# Patient Record
Sex: Female | Born: 1985 | Hispanic: No | Marital: Married | Smoking: Never smoker
Health system: Southern US, Community
[De-identification: ages and names within clinical notes are randomized; demographics above are authoritative.]

---

## 2011-07-08 DIAGNOSIS — R05 Cough: Secondary | ICD-10-CM | POA: Insufficient documentation

## 2011-07-08 DIAGNOSIS — R059 Cough, unspecified: Secondary | ICD-10-CM | POA: Insufficient documentation

## 2011-07-08 DIAGNOSIS — R509 Fever, unspecified: Secondary | ICD-10-CM | POA: Insufficient documentation

## 2011-07-08 DIAGNOSIS — R51 Headache: Secondary | ICD-10-CM | POA: Insufficient documentation

## 2011-07-08 DIAGNOSIS — R5383 Other fatigue: Secondary | ICD-10-CM | POA: Insufficient documentation

## 2011-07-08 DIAGNOSIS — R5381 Other malaise: Secondary | ICD-10-CM | POA: Insufficient documentation

## 2011-07-08 DIAGNOSIS — J029 Acute pharyngitis, unspecified: Secondary | ICD-10-CM | POA: Insufficient documentation

## 2011-07-09 ENCOUNTER — Emergency Department (HOSPITAL_COMMUNITY)
Admission: EM | Admit: 2011-07-09 | Discharge: 2011-07-09 | Disposition: A | Discharge status: Home / Self Care | Payer: Managed Care, Other (non HMO) | Attending: Emergency Medicine | Admitting: Emergency Medicine

## 2011-07-09 DIAGNOSIS — J029 Acute pharyngitis, unspecified: Secondary | ICD-10-CM

## 2011-07-09 MED ORDER — HYDROCOD POLST-CHLORPHEN POLST 10-8 MG/5ML PO LQCR: 5.0000 mL | Freq: Two times a day (BID) | ORAL | Status: DC

## 2011-07-09 MED ORDER — DEXAMETHASONE SODIUM PHOSPHATE 10 MG/ML IJ SOLN
10.0000 mg | Freq: Once | INTRAMUSCULAR | Status: AC
  Administered 2011-07-09: 10 mg via INTRAMUSCULAR
  Filled 2011-07-09: qty 1

## 2011-07-09 NOTE — ED Provider Notes (Signed)
History     CSN: [id number] Arrival date & time: 07/09/2011 12:36 AM   First MD Initiated Contact with Patient 07/09/11 0128      Chief Complaint  Patient presents with  . Sore Throat    (Consider location/radiation/quality/duration/timing/severity/associated sxs/prior treatment) HPI Comments: Patient here with cough, dry and hacking that started yesterday - states now progressed with sore throat and aching, reports fever but no chills, is able to swallow secretions.  Patient is a 25 y.o. female presenting with pharyngitis. The history is provided by the patient. No language interpreter was used.  Sore Throat This is a new problem. The current episode started yesterday. The problem occurs constantly. The problem has been unchanged. Associated symptoms include arthralgias, congestion, coughing, fatigue, a fever, headaches and a sore throat. Pertinent negatives include no anorexia, chills, joint swelling, myalgias, neck pain, swollen glands or vomiting. The symptoms are aggravated by nothing. She has tried nothing for the symptoms. The treatment provided no relief.    History reviewed. No pertinent past medical history.  History reviewed. No pertinent past surgical history.  No family history on file.  History  Substance Use Topics  . Smoking status: Never Smoker   . Smokeless tobacco: Not on file  . Alcohol Use: No    OB History    Grav Para Term Preterm Abortions TAB SAB Ect Mult Living                  Review of Systems  Constitutional: Positive for fever and fatigue. Negative for chills.  HENT: Positive for congestion and sore throat. Negative for neck pain.   Respiratory: Positive for cough.   Gastrointestinal: Negative for vomiting and anorexia.  Musculoskeletal: Positive for arthralgias. Negative for myalgias and joint swelling.  Neurological: Positive for headaches.  All other systems reviewed and are negative.    Allergies  Penicillins  Home Medications    Current Outpatient Rx  Name Route Sig Dispense Refill  . DEXTROMETHORPHAN POLISTIREX ER 30 MG/5ML PO LQCR Oral Take 60 mg by mouth as needed. cough       BP 96/64  Pulse 107  Temp(Src) 100.3 F (37.9 C) (Oral)  Resp 16  Wt 93 lb 11.2 oz (42.502 kg)  SpO2 98%  LMP 07/08/2011  Physical Exam  Nursing note and vitals reviewed. Constitutional: She is oriented to person, place, and time. She appears well-developed and well-nourished.  HENT:  Head: Normocephalic and atraumatic.  Right Ear: External ear normal.  Left Ear: External ear normal.  Nose: Nose normal.  Mouth/Throat: Oropharynx is clear and moist. No oropharyngeal exudate.  Eyes: Conjunctivae are normal. Pupils are equal, round, and reactive to light.  Neck: Normal range of motion. Neck supple.  Cardiovascular: Normal rate, regular rhythm and normal heart sounds.   Pulmonary/Chest: Effort normal and breath sounds normal. No respiratory distress. She has no wheezes. She has no rales. She exhibits no tenderness.  Abdominal: Soft. Bowel sounds are normal. There is no tenderness.  Musculoskeletal: Normal range of motion.  Lymphadenopathy:    She has no cervical adenopathy.  Neurological: She is alert and oriented to person, place, and time.  Skin: Skin is warm and dry. No rash noted.  Psychiatric: She has a normal mood and affect. Her behavior is normal. Judgment and thought content normal.    ED Course  Procedures (including critical care time)   Labs Reviewed  RAPID STREP SCREEN   No results found.   Viral Pharyngitis   MDM  Strep  negative - patient with likely viral pharyngitis - given decadron here for the swelling - will continue with conservative management.        Izola Price [place], [place] 07/09/11 [phone]

## 2011-07-09 NOTE — ED Notes (Signed)
Pt reports a sore throat since Sunday.  A cough an general body aches started just prior to Sunday and persists.

## 2011-07-09 NOTE — ED Notes (Signed)
Pt reports that yesterday she had a cough, Sunday morning she awoke with sore throat and began aching all over as the day progressed.  Pt denies nausea or vomiting.  Pt reports that she used liquid medicine for sore throat without relief

## 2011-07-10 NOTE — ED Provider Notes (Signed)
Medical screening examination/treatment/procedure(s) were performed by non-physician practitioner and as supervising physician I was immediately available for consultation/collaboration.  Juliet Rude. Rubin Payor, MD 07/10/11 1946

## 2012-07-01 ENCOUNTER — Emergency Department (HOSPITAL_COMMUNITY)
Admission: EM | Admit: 2012-07-01 | Discharge: 2012-07-01 | Disposition: A | Discharge status: Home / Self Care | Payer: Managed Care, Other (non HMO) | Attending: Emergency Medicine | Admitting: Emergency Medicine

## 2012-07-01 ENCOUNTER — Encounter (HOSPITAL_COMMUNITY): Payer: Self-pay | Admitting: *Deleted

## 2012-07-01 DIAGNOSIS — E860 Dehydration: Secondary | ICD-10-CM

## 2012-07-01 DIAGNOSIS — K2970 Gastritis, unspecified, without bleeding: Secondary | ICD-10-CM | POA: Insufficient documentation

## 2012-07-01 LAB — URINALYSIS, ROUTINE W REFLEX MICROSCOPIC
Hgb urine dipstick: NEGATIVE
Nitrite: NEGATIVE
Specific Gravity, Urine: 1.014 (ref 1.005–1.030)
Urobilinogen, UA: 0.2 mg/dL (ref 0.0–1.0)
pH: 8 (ref 5.0–8.0)

## 2012-07-01 LAB — CBC WITH DIFFERENTIAL/PLATELET
Basophils Absolute: 0 10*3/uL (ref 0.0–0.1)
Eosinophils Relative: 0 % (ref 0–5)
HCT: 38.7 % (ref 36.0–46.0)
Hemoglobin: 13.4 g/dL (ref 12.0–15.0)
Lymphocytes Relative: 24 % (ref 12–46)
Lymphs Abs: 1.8 10*3/uL (ref 0.7–4.0)
MCV: 82.5 fL (ref 78.0–100.0)
Monocytes Absolute: 0.4 10*3/uL (ref 0.1–1.0)
Monocytes Relative: 5 % (ref 3–12)
Neutro Abs: 5.2 10*3/uL (ref 1.7–7.7)
RBC: 4.69 MIL/uL (ref 3.87–5.11)
RDW: 13.5 % (ref 11.5–15.5)
WBC: 7.4 10*3/uL (ref 4.0–10.5)

## 2012-07-01 LAB — COMPREHENSIVE METABOLIC PANEL
Albumin: 4.3 g/dL (ref 3.5–5.2)
BUN: 10 mg/dL (ref 6–23)
Calcium: 9.6 mg/dL (ref 8.4–10.5)
Creatinine, Ser: 0.67 mg/dL (ref 0.50–1.10)
GFR calc Af Amer: >90 mL/min (ref >90)
Glucose, Bld: 84 mg/dL (ref 70–99)
Potassium: 3.9 mEq/L (ref 3.5–5.1)
Total Protein: 7.9 g/dL (ref 6.0–8.3)

## 2012-07-01 LAB — URINE MICROSCOPIC-ADD ON

## 2012-07-01 MED ORDER — ONDANSETRON 8 MG PO TBDP
8.0000 mg | ORAL_TABLET | Freq: Once | ORAL | Status: AC
  Administered 2012-07-01: 8 mg via ORAL
  Filled 2012-07-01: qty 1

## 2012-07-01 MED ORDER — ONDANSETRON HCL 8 MG PO TABS: 8.0000 mg | ORAL_TABLET | Freq: Three times a day (TID) | ORAL | Status: DC | PRN

## 2012-07-01 MED ORDER — OMEPRAZOLE 20 MG PO CPDR: 20.0000 mg | DELAYED_RELEASE_CAPSULE | Freq: Every day | ORAL | Status: DC

## 2012-07-01 MED ORDER — PANTOPRAZOLE SODIUM 40 MG PO TBEC
40.0000 mg | DELAYED_RELEASE_TABLET | Freq: Once | ORAL | Status: AC
  Administered 2012-07-01: 40 mg via ORAL
  Filled 2012-07-01: qty 1

## 2012-07-01 NOTE — ED Provider Notes (Signed)
History     CSN: [id number]  Arrival date & time 07/01/12  7829   First MD Initiated Contact with Patient 07/01/12 2043      Chief Complaint  Patient presents with  . Dizziness    (Consider location/radiation/quality/duration/timing/severity/associated sxs/prior treatment) HPI History provided by pt.   Pt c/o 3 days of intermittent lightheadedness, generalized weakness, palpitations, described as being aware of heartbeat, chills and nausea.  Has also had intermittent, mild posterior headache.  Has not had fever, sore throat, CP/SOB, abd pain, diarrhea, GU sx or rash.  Periods have been regular.  No PMH.   History reviewed. No pertinent past medical history.  History reviewed. No pertinent past surgical history.  No family history on file.  History  Substance Use Topics  . Smoking status: Never Smoker   . Smokeless tobacco: Not on file  . Alcohol Use: No    OB History    Grav Para Term Preterm Abortions TAB SAB Ect Mult Living                  Review of Systems  All other systems reviewed and are negative.    Allergies  Penicillins  Home Medications  No current outpatient prescriptions on file.  BP 112/68  Pulse 111  Temp 98.6 F (37 C) (Oral)  Resp 18  SpO2 100%  LMP 05/31/2012  Physical Exam  Nursing note and vitals reviewed. Constitutional: She is oriented to person, place, and time. She appears well-developed and well-nourished. No distress.  HENT:  Head: Normocephalic and atraumatic.  Mouth/Throat: Oropharynx is clear and moist. No oropharyngeal exudate.  Eyes:       Normal appearance  Neck: Normal range of motion.  Cardiovascular: Normal rate, regular rhythm and intact distal pulses.   Pulmonary/Chest: Effort normal and breath sounds normal.  Abdominal: Soft. Bowel sounds are normal. She exhibits no distension and no mass. There is tenderness. There is no rebound and no guarding.       Mild tenderness down mid-line abd  Genitourinary:   No CVA ttp  Musculoskeletal: Normal range of motion.       No LE edema  Lymphadenopathy:    She has no cervical adenopathy.  Neurological: She is alert and oriented to person, place, and time. No sensory deficit. Coordination normal.       CN 3-12 intact.  No nystagmus.  5/5 and equal upper and lower extremity strength.  No past pointing.    Skin: Skin is warm and dry. No rash noted.  Psychiatric: She has a normal mood and affect. Her behavior is normal.    ED Course  Procedures (including critical care time)   Date: 07/02/2012  Rate: 101  Rhythm: sinus tachycardia  QRS Axis: normal  Intervals: normal  ST/T Wave abnormalities: normal  Conduction Disutrbances:none  Narrative Interpretation:   Old EKG Reviewed: none available   Labs Reviewed  URINALYSIS, ROUTINE W REFLEX MICROSCOPIC - Abnormal; Notable for the following:    Leukocytes, UA SMALL (*)     All other components within normal limits  COMPREHENSIVE METABOLIC PANEL  CBC WITH DIFFERENTIAL  PREGNANCY, URINE  URINE MICROSCOPIC-ADD ON   No results found.   1. Dehydration   2. Gastritis       MDM  26yo healthy F presents w/ vague complaints, including 3 days of intermittent lightheadedness, generalized weakness, nausea and palpitations.  On exam, afebrile, non-toxic appearing, moist mucous membranes,  RRR, lungs clear, mid-line abd ttp, no focal neuro deficits.  Labs unremarkable w/ exception of U/A and urine preg which are pending. EKG non-ischemic and sinus.    Labs unremarkable.  Results discussed w/ pt.  On re-examination of abdomen, tenderness isolated to epigastrium and RUQ.  Suspect gastritis w/ mild dehydration.  Pt is tolerating pos.  She has received po protonix and zofran.  Referred to [facility].  Also referred to ENT for complaint of having difficulty swallowing her secretions in her sleep x 13 years at time of discharge.  Return precautions discussed. 11:03 PM         Otilio Miu,  PA-C 07/02/12 0429  Otilio Miu, PA-C 07/02/12 [phone]

## 2012-07-01 NOTE — ED Notes (Signed)
MD at bedside. 

## 2012-07-01 NOTE — ED Notes (Signed)
Tech notified patient of need for urine sample- patient unable to void

## 2012-07-01 NOTE — ED Notes (Signed)
Pt c/o legs/hands feeling cold; dizziness; can't stand for long periods of time; feeling weak x 2 days; no pain

## 2012-07-04 NOTE — ED Provider Notes (Signed)
Medical screening examination/treatment/procedure(s) were performed by non-physician practitioner and as supervising physician I was immediately available for consultation/collaboration.  Flint Melter, MD 07/04/12 [phone]

## 2013-05-29 ENCOUNTER — Encounter (HOSPITAL_COMMUNITY): Payer: Self-pay | Admitting: Emergency Medicine

## 2013-05-29 ENCOUNTER — Emergency Department (HOSPITAL_COMMUNITY)
Admission: EM | Admit: 2013-05-29 | Discharge: 2013-05-29 | Disposition: A | Discharge status: Home / Self Care | Payer: Managed Care, Other (non HMO) | Attending: Emergency Medicine | Admitting: Emergency Medicine

## 2013-05-29 DIAGNOSIS — Z3202 Encounter for pregnancy test, result negative: Secondary | ICD-10-CM | POA: Insufficient documentation

## 2013-05-29 DIAGNOSIS — E860 Dehydration: Secondary | ICD-10-CM | POA: Insufficient documentation

## 2013-05-29 DIAGNOSIS — Z880 Allergy status to penicillin: Secondary | ICD-10-CM | POA: Insufficient documentation

## 2013-05-29 LAB — CBC WITH DIFFERENTIAL/PLATELET
Basophils Absolute: 0 10*3/uL (ref 0.0–0.1)
Basophils Relative: 1 % (ref 0–1)
Eosinophils Absolute: 0.1 10*3/uL (ref 0.0–0.7)
Eosinophils Relative: 2 % (ref 0–5)
MCH: 27.5 pg (ref 26.0–34.0)
MCV: 81.3 fL (ref 78.0–100.0)
Platelets: 182 10*3/uL (ref 150–400)
RDW: 14 % (ref 11.5–15.5)

## 2013-05-29 LAB — COMPREHENSIVE METABOLIC PANEL
ALT: 12 U/L (ref 0–35)
AST: 22 U/L (ref 0–37)
Albumin: 4.4 g/dL (ref 3.5–5.2)
Calcium: 9.8 mg/dL (ref 8.4–10.5)
GFR calc Af Amer: >90 mL/min (ref >90)
Sodium: 137 mEq/L (ref 135–145)
Total Protein: 8 g/dL (ref 6.0–8.3)

## 2013-05-29 LAB — URINALYSIS, ROUTINE W REFLEX MICROSCOPIC
Bilirubin Urine: NEGATIVE
Hgb urine dipstick: NEGATIVE
Nitrite: NEGATIVE
Protein, ur: NEGATIVE mg/dL
Urobilinogen, UA: 0.2 mg/dL (ref 0.0–1.0)

## 2013-05-29 MED ORDER — SODIUM CHLORIDE 0.9 % IV BOLUS (SEPSIS)
1000.0000 mL | Freq: Once | INTRAVENOUS | Status: AC
  Administered 2013-05-29: 1000 mL via INTRAVENOUS

## 2013-05-29 MED ORDER — SODIUM CHLORIDE 0.9 % IV SOLN
INTRAVENOUS | Status: DC
  Administered 2013-05-29: 16:00:00 via INTRAVENOUS

## 2013-05-29 NOTE — Progress Notes (Signed)
Patient confirms that she does not have a pcp.  Patient confirmed she has [employer].  EDCM provided patient with a list of pcps who accept [employer] within five miles of her zip code.  EDCM explained the importance of having a pcp.  Patient thankful for resources.  No further needs at this time.  [facility] offered patient support.

## 2013-05-29 NOTE — ED Notes (Signed)
Pt escorted to discharge window. Verbalized understanding discharge instructions. In no acute distress.   

## 2013-05-29 NOTE — ED Provider Notes (Signed)
CSN: [id number]     Arrival date & time 05/29/13  1443 History   First MD Initiated Contact with Patient 05/29/13 1502     Chief Complaint  Patient presents with  . Dizziness   (Consider location/radiation/quality/duration/timing/severity/associated sxs/prior Treatment) The history is provided by the patient.  pt here with dizziness x 5 days--sx worse with standing and described as room spinning--no headache or ear pain, no nausea or emesis--no prior h/o same, no tx used pta--denies and chest pain, dyspnea, abd pain, vaginal bleeding--took a home preg test last pm that was neg per pt--sx last for seconds to minutes and can occur even when pt is sitting--no prior h/o same, no new meds--denies fever or diarrhea  History reviewed. No pertinent past medical history. History reviewed. No pertinent past surgical history. No family history on file. History  Substance Use Topics  . Smoking status: Never Smoker   . Smokeless tobacco: Not on file  . Alcohol Use: No   OB History   Grav Para Term Preterm Abortions TAB SAB Ect Mult Living                 Review of Systems  All other systems reviewed and are negative.    Allergies  Penicillins  Home Medications  No current outpatient prescriptions on file. BP 101/63  Pulse 80  Temp(Src) 98.4 F (36.9 C) (Oral)  Resp 18  SpO2 100%  LMP 04/30/2013 Physical Exam  Nursing note and vitals reviewed. Constitutional: She is oriented to person, place, and time. She appears well-developed and well-nourished.  Non-toxic appearance. No distress.  HENT:  Head: Normocephalic and atraumatic.  Eyes: Conjunctivae, EOM and lids are normal. Pupils are equal, round, and reactive to light.  Neck: Normal range of motion. Neck supple. No tracheal deviation present. No mass present.  Cardiovascular: Normal rate, regular rhythm and normal heart sounds.  Exam reveals no gallop.   No murmur heard. Pulmonary/Chest: Effort normal and breath sounds normal.  No stridor. No respiratory distress. She has no decreased breath sounds. She has no wheezes. She has no rhonchi. She has no rales.  Abdominal: Soft. Normal appearance and bowel sounds are normal. She exhibits no distension. There is no tenderness. There is no rebound and no CVA tenderness.  Musculoskeletal: Normal range of motion. She exhibits no edema and no tenderness.  Neurological: She is alert and oriented to person, place, and time. She has normal strength. No cranial nerve deficit or sensory deficit. GCS eye subscore is 4. GCS verbal subscore is 5. GCS motor subscore is 6.  Skin: Skin is warm and dry. No abrasion and no rash noted.  Psychiatric: She has a normal mood and affect. Her speech is normal and behavior is normal.    ED Course  Procedures (including critical care time) Labs Review Labs Reviewed  URINALYSIS, ROUTINE W REFLEX MICROSCOPIC  CBC WITH DIFFERENTIAL  COMPREHENSIVE METABOLIC PANEL   Imaging Review No results found.  EKG Interpretation     Ventricular Rate:  88 PR Interval:  137 QRS Duration: 72 QT Interval:  359 QTC Calculation: 434 R Axis:   67 Text Interpretation:  Sinus rhythm Baseline wander in lead(s) V6            MDM  No diagnosis found. Patient given IV fluids here and feels better. She relates that she only eats once per day and has had increased stress caused by her studies at the [employer]. Patient is not orthostatic and is stable for discharge at  this time    Toy Baker, MD 05/29/13 [phone]

## 2013-05-29 NOTE — ED Notes (Signed)
Pt denies dizziness at moment. Assisting pt to bathroom.

## 2013-05-29 NOTE — ED Notes (Signed)
Per pt dizzy and nauseated for 5 days

## 2013-06-07 ENCOUNTER — Emergency Department (HOSPITAL_COMMUNITY)
Admission: EM | Admit: 2013-06-07 | Discharge: 2013-06-07 | Disposition: A | Discharge status: Home / Self Care | Payer: Managed Care, Other (non HMO) | Attending: Emergency Medicine | Admitting: Emergency Medicine

## 2013-06-07 ENCOUNTER — Encounter (HOSPITAL_COMMUNITY): Payer: Self-pay | Admitting: Emergency Medicine

## 2013-06-07 ENCOUNTER — Emergency Department (HOSPITAL_COMMUNITY): Payer: Managed Care, Other (non HMO)

## 2013-06-07 DIAGNOSIS — O200 Threatened abortion: Secondary | ICD-10-CM | POA: Diagnosis present

## 2013-06-07 DIAGNOSIS — R1031 Right lower quadrant pain: Secondary | ICD-10-CM | POA: Diagnosis present

## 2013-06-07 DIAGNOSIS — O209 Hemorrhage in early pregnancy, unspecified: Secondary | ICD-10-CM | POA: Insufficient documentation

## 2013-06-07 DIAGNOSIS — Z3490 Encounter for supervision of normal pregnancy, unspecified, unspecified trimester: Secondary | ICD-10-CM

## 2013-06-07 DIAGNOSIS — Z880 Allergy status to penicillin: Secondary | ICD-10-CM | POA: Insufficient documentation

## 2013-06-07 LAB — URINALYSIS, ROUTINE W REFLEX MICROSCOPIC
Bilirubin Urine: NEGATIVE
Hgb urine dipstick: NEGATIVE
Nitrite: NEGATIVE
Protein, ur: NEGATIVE mg/dL
Specific Gravity, Urine: 1.018 (ref 1.005–1.030)
Urobilinogen, UA: 0.2 mg/dL (ref 0.0–1.0)

## 2013-06-07 LAB — CBC
Hemoglobin: 11.8 g/dL — ABNORMAL LOW (ref 12.0–15.0)
MCH: 27.6 pg (ref 26.0–34.0)
MCHC: 33.7 g/dL (ref 30.0–36.0)
MCV: 82 fL (ref 78.0–100.0)
RBC: 4.27 MIL/uL (ref 3.87–5.11)
RDW: 14.3 % (ref 11.5–15.5)

## 2013-06-07 LAB — POCT PREGNANCY, URINE: Preg Test, Ur: POSITIVE — AB

## 2013-06-07 LAB — ABO/RH: ABO/RH(D): B NEG

## 2013-06-07 MED ORDER — RHO D IMMUNE GLOBULIN 1500 UNIT/2ML IJ SOLN
300.0000 ug | Freq: Once | INTRAMUSCULAR | Status: AC
  Administered 2013-06-07: 300 ug via INTRAMUSCULAR
  Filled 2013-06-07: qty 2

## 2013-06-07 NOTE — ED Notes (Signed)
Pt stated that she is continuing to "spot". Pt stated that her period was 2 days late. No increase pain reported

## 2013-06-07 NOTE — ED Notes (Signed)
Pt states that she took a pregnancy test this morning and it was positive.  When asked why she came to the ER for this, pt stated "Well, I'm spotting too".  Denies pain.

## 2013-06-07 NOTE — ED Notes (Signed)
Pt came to ER after taking a pregnancy test this morning that was positive. Pt also c/o scant amount of blood everyday when going to the bathroom. Pt c/o intermittent cramping pain 1/10.  Pt also c/o intermittent nausea. Denies dizzines, vomiting, fever. A&Ox4. NAD noted.

## 2013-06-07 NOTE — ED Provider Notes (Signed)
CSN: [id number]     Arrival date & time 06/07/13  1302 History   First MD Initiated Contact with Patient 06/07/13 1718     Chief Complaint  Patient presents with  . Vaginal Bleeding  . Possible Pregnancy   (Consider location/radiation/quality/duration/timing/severity/associated sxs/prior Treatment) Patient is a 27 y.o. female presenting with vaginal bleeding and pregnancy problem. The history is provided by the patient.  Vaginal Bleeding Quality:  Spotting Severity:  Mild Onset quality:  Sudden Duration:  3 days Timing:  Intermittent Progression:  Unchanged Chronicity:  New Possible pregnancy: yes   Context: at rest   Relieved by:  Nothing Worsened by:  Nothing tried Ineffective treatments:  None tried Associated symptoms: abdominal pain (mild occasional sharp pain in RLQ)   Associated symptoms: no back pain, no dizziness, no dysuria, no fatigue, no fever and no nausea   Possible Pregnancy  Primary symptoms include abdominal pain (mild occasional sharp pain in RLQ) and vaginal bleeding.  Associated symptoms include no dysuria, no fever, no headaches, no nausea, no shortness of breath and no vomiting.    History reviewed. No pertinent past medical history. No past surgical history on file. No family history on file. History  Substance Use Topics  . Smoking status: Never Smoker   . Smokeless tobacco: Not on file  . Alcohol Use: No   OB History   Grav Para Term Preterm Abortions TAB SAB Ect Mult Living                 Review of Systems  Constitutional: Negative for fever and fatigue.  HENT: Negative for congestion and drooling.   Eyes: Negative for pain.  Respiratory: Negative for cough and shortness of breath.   Cardiovascular: Negative for chest pain.  Gastrointestinal: Positive for abdominal pain (mild occasional sharp pain in RLQ). Negative for nausea, vomiting and diarrhea.  Genitourinary: Positive for vaginal bleeding. Negative for dysuria and hematuria.   Musculoskeletal: Negative for back pain, gait problem and neck pain.  Skin: Negative for color change.  Neurological: Negative for dizziness and headaches.  Hematological: Negative for adenopathy.  Psychiatric/Behavioral: Negative for behavioral problems.  All other systems reviewed and are negative.    Allergies  Penicillins  Home Medications  No current outpatient prescriptions on file. BP 104/71  Pulse 91  Temp(Src) 99.1 F (37.3 C) (Oral)  Resp 18  SpO2 100%  LMP 05/30/2013 Physical Exam  Nursing note and vitals reviewed. Constitutional: She is oriented to person, place, and time. She appears well-developed and well-nourished.  HENT:  Head: Normocephalic.  Mouth/Throat: Oropharynx is clear and moist. No oropharyngeal exudate.  Eyes: Conjunctivae and EOM are normal. Pupils are equal, round, and reactive to light.  Neck: Normal range of motion. Neck supple.  Cardiovascular: Normal rate, regular rhythm, normal heart sounds and intact distal pulses.  Exam reveals no gallop and no friction rub.   No murmur heard. Pulmonary/Chest: Effort normal and breath sounds normal. No respiratory distress. She has no wheezes.  Abdominal: Soft. Bowel sounds are normal. There is no tenderness. There is no rebound and no guarding.  Musculoskeletal: Normal range of motion. She exhibits no edema and no tenderness.  Neurological: She is alert and oriented to person, place, and time.  Skin: Skin is warm and dry.  Psychiatric: She has a normal mood and affect. Her behavior is normal.    ED Course  Procedures (including critical care time) Labs Review Labs Reviewed  URINALYSIS, ROUTINE W REFLEX MICROSCOPIC - Abnormal; Notable for the  following:    APPearance CLOUDY (*)    All other components within normal limits  HCG, QUANTITATIVE, PREGNANCY - Abnormal; Notable for the following:    hCG, Beta Chain, Quant, S 1405 (*)    All other components within normal limits  CBC - Abnormal; Notable  for the following:    Hemoglobin 11.8 (*)    HCT 35.0 (*)    All other components within normal limits  POCT PREGNANCY, URINE - Abnormal; Notable for the following:    Preg Test, Ur POSITIVE (*)    All other components within normal limits  ABO/RH  RH IG WORKUP (INCLUDES ABO/RH)   Imaging Review US Ob Comp Less 14 Wks  06/07/2013   CLINICAL DATA:  27 year old female with pelvic cramps. Pink discharge. Quantitative beta HCG 1,405. Estimated gestational age by LMP 4 weeks and 6 days.  EXAM: OBSTETRIC <14 WK [place] AND TRANSVAGINAL OB US  TECHNIQUE: Both transabdominal and transvaginal ultrasound examinations were performed for complete evaluation of the gestation as well as the maternal uterus, adnexal regions, and pelvic cul-de-sac. Transvaginal technique was performed to assess early pregnancy.  COMPARISON:  None.  FINDINGS: Intrauterine gestational sac: None visible.  Yolk sac:  None  Embryo:  None  Cardiac Activity: None  Maternal uterus/adnexae:  Endometrium thickened to 12 mm and appears homogeneous.  Normal right ovary measuring 2.8 x 1.3 x 3.0 cm. Several small right ovarian follicles.  Larger left ovary measuring 4.1 x 2.4 x 2.4 cm. Several small follicles. Small peripherally hypervascular hypoechoic area measuring about 12 mm (image 42) which might represent a corpus luteum.  No pelvic free fluid.  IMPRESSION: No intrauterine gestational sac identified. No suspicious adnexal mass or pelvic free fluid.  Recommend follow-up quantitative B-HCG levels and follow-up US as necessary to confirm and/or assess viability.   Electronically Signed   By: Augusto Gamble M.D.   On: 06/07/2013 20:33   US Ob Transvaginal  06/07/2013   CLINICAL DATA:  27 year old female with pelvic cramps. Pink discharge. Quantitative beta HCG 1,405. Estimated gestational age by LMP 4 weeks and 6 days.  EXAM: OBSTETRIC <14 WK [place] AND TRANSVAGINAL OB US  TECHNIQUE: Both transabdominal and transvaginal ultrasound examinations were  performed for complete evaluation of the gestation as well as the maternal uterus, adnexal regions, and pelvic cul-de-sac. Transvaginal technique was performed to assess early pregnancy.  COMPARISON:  None.  FINDINGS: Intrauterine gestational sac: None visible.  Yolk sac:  None  Embryo:  None  Cardiac Activity: None  Maternal uterus/adnexae:  Endometrium thickened to 12 mm and appears homogeneous.  Normal right ovary measuring 2.8 x 1.3 x 3.0 cm. Several small right ovarian follicles.  Larger left ovary measuring 4.1 x 2.4 x 2.4 cm. Several small follicles. Small peripherally hypervascular hypoechoic area measuring about 12 mm (image 42) which might represent a corpus luteum.  No pelvic free fluid.  IMPRESSION: No intrauterine gestational sac identified. No suspicious adnexal mass or pelvic free fluid.  Recommend follow-up quantitative B-HCG levels and follow-up US as necessary to confirm and/or assess viability.   Electronically Signed   By: Augusto Gamble M.D.   On: 06/07/2013 20:33    EKG Interpretation   None       MDM   1. Pregnancy   2. Right lower quadrant pain   3. Threatened abortion    5:59 PM 27 y.o. female G1P0 (LMP Sept 26th) who pw occasional sharp pain in right lower quadrant and vaginal spotting for the last  2-3 days. Pt found to preg on home preg test this morning. The patient denies any symptoms on exam currently and abdomen is benign here. She is afebrile and vital signs are unremarkable here. I discussed the importance of a pelvic exam in her workup including the exclusion of trauma, infection, or expelled products of conception. She understands and refuses a pelvic exam. Offered a female to do the exam, she still refuses. Will get screening labwork.  Pt agreed to do TVUS. Hcg is 1405.   Rh neg. Gave rhogam.   No IUP seen, no findings concerning for ectopic. Pt will need f/u hcg and [place]. Discussed w/ Dr. Erin Fulling (obgyn).   9:37 PM:  I have discussed the  diagnosis/risks/treatment options with the patient and believe the pt to be eligible for discharge home to follow-up with Dr. Erin Fulling, pt will be called tomorrow. We also discussed returning to the ED immediately if new or worsening sx occur. We discussed the sx which are most concerning (e.g., worsening pain, syncope, fever) that necessitate immediate return. Any new prescriptions provided to the patient are listed below.  There are no discharge medications for this patient.    Junius Argyle, MD 06/07/13 [phone]

## 2013-06-08 LAB — RH IG WORKUP (INCLUDES ABO/RH)
ABO/RH(D): B NEG
Antibody Screen: NEGATIVE
Unit division: 0

## 2013-06-10 ENCOUNTER — Encounter: Payer: Self-pay | Admitting: Obstetrics & Gynecology

## 2013-06-10 ENCOUNTER — Ambulatory Visit (INDEPENDENT_AMBULATORY_CARE_PROVIDER_SITE_OTHER): Payer: Managed Care, Other (non HMO) | Admitting: Obstetrics & Gynecology

## 2013-06-10 VITALS — BP 101/66 | HR 82 | Temp 97.7°F | Resp 16 | Ht 61.0 in | Wt 94.6 lb

## 2013-06-10 DIAGNOSIS — O209 Hemorrhage in early pregnancy, unspecified: Secondary | ICD-10-CM

## 2013-06-10 MED ORDER — PRENATAL VITAMINS 0.8 MG PO TABS: 1.0000 | ORAL_TABLET | Freq: Every day | ORAL | Status: DC

## 2013-06-10 NOTE — Progress Notes (Signed)
Pt seen @ [facility] ED for vaginal bleeding- had +UPT but negative [place].

## 2013-06-10 NOTE — Patient Instructions (Signed)
Vaginal Bleeding During Pregnancy, First Trimester A small amount of bleeding (spotting) is relatively common in early pregnancy. It usually stops on its own. There are many causes for bleeding or spotting in early pregnancy. Some bleeding may be related to the pregnancy and some may not. Cramping with the bleeding is more serious and concerning. Tell your caregiver if you have any vaginal bleeding.  CAUSES   It is normal in most cases.  The pregnancy ends (miscarriage).  The pregnancy may end (threatened miscarriage).  Infection or inflammation of the cervix.  Growths (polyps) on the cervix.  Pregnancy happens outside of the uterus and in a fallopian tube (tubal pregnancy).  Many tiny cysts in the uterus instead of pregnancy tissue (molar pregnancy). SYMPTOMS  Vaginal bleeding or spotting with or without cramps. DIAGNOSIS  To evaluate the pregnancy, your caregiver may:  Do a pelvic exam.  Take blood tests.  Do an ultrasound. It is very important to follow your caregiver's instructions.  TREATMENT   Evaluation of the pregnancy with blood tests and ultrasound.  Bed rest (getting up to use the bathroom only).  Rho-gam immunization if the mother is Rh negative and the father is Rh positive. HOME CARE INSTRUCTIONS   If your caregiver orders bed rest, you may need to make arrangements for the care of other children and for other responsibilities. However, your caregiver may allow you to continue light activity.  Keep track of the number of pads you use each day, how often you change pads and how soaked (saturated) they are. Write this down.  Do not use tampons. Do not douche.  Do not have sexual intercourse or orgasms until approved by your physician.  Save any tissue that you pass for your caregiver to see.  Take medicine for cramps only with your caregiver's permission.  Do not take aspirin because it can make you bleed. SEEK IMMEDIATE MEDICAL CARE IF:   You  experience severe cramps in your stomach, back or belly (abdomen).  You have an oral temperature above 102 F (38.9 C), not controlled by medicine.  You pass large clots or tissue.  Your bleeding increases or you become light-headed, weak or have fainting episodes.  You develop chills.  You are leaking or have a gush of fluid from your vagina.  You pass out while having a bowel movement. That may mean you have a ruptured tubal pregnancy. Document Released: 05/02/2005 Document Revised: 10/15/2011 Document Reviewed: 11/11/2008 [facility] Patient Information 2014 [place], [place]. Ectopic Pregnancy An ectopic pregnancy is when the fertilized egg attaches (implants) outside the uterus. Most ectopic pregnancies occur in the fallopian tube. Rarely do ectopic pregnancies occur on the ovary, intestine, pelvis, or cervix. An ectopic pregnancy does not have the ability to develop into a normal, healthy baby.  A ruptured ectopic pregnancy is one in which the fallopian tube gets torn or bursts and results in internal bleeding. Often there is intense abdominal pain, and sometimes, vaginal bleeding. Having an ectopic pregnancy can be a life-threatening experience. If left untreated, this dangerous condition can lead to a blood transfusion, abdominal surgery, or even death. CAUSES  Damage to the fallopian tubes is the suspected cause in most ectopic pregnancies.  RISK FACTORS Depending on your circumstances, the amount of risk of having an ectopic pregnancy will vary. There are 3 categories that may help you identify whether you are potentially at risk. High Risk  You have gone through infertility treatment.  You have had a previous ectopic pregnancy.  You have had previous tubal surgery.  You have had previous surgery to have the fallopian tubes tied (tubal ligation).  You have tubal problems or diseases.  You have been exposed to DES. DES is a medicine that was used until 1971 and had effects on  babies whose mothers took the medicine.  You become pregnant while using an intrauterine device (IUD) for birth control. Moderate Risk  You have a history of infertility.  You have a history of a sexually transmitted infection (STI).  You have a history of pelvic inflammatory disease (PID).  You have scarring from endometriosis.  You have multiple sexual partners.  You smoke. Low Risk  You have had previous pelvic surgery.  You use vaginal douching.  You became sexually active before 26 years of age. SYMPTOMS  An ectopic pregnancy should be suspected in anyone who has missed a period and has abdominal pain or bleeding.  You may experience normal pregnancy symptoms, such as:  Nausea.  Tiredness.  Breast tenderness.  Symptoms that are not normal include:  Pain with intercourse.  Irregular vaginal bleeding or spotting.  Cramping or pain on one side, or in the lower abdomen.  Fast heartbeat.  Passing out while having a bowel movement.  Symptoms of a ruptured ectopic pregnancy and internal bleeding may include:  Sudden, severe pain in the abdomen and pelvis.  Dizziness or fainting.  Pain in the shoulder area. DIAGNOSIS  Tests that may be performed include:  A pregnancy test.  An ultrasound.  Testing the specific level of pregnancy hormone in the bloodstream.  Taking a sample of uterus tissue (dilation and curettage, D&C).  Surgery to perform a visual exam of the inside of the abdomen using a lighted tube (laparoscopy). TREATMENT  An injection of methotrexate medicine may be given. This is given if:  The diagnosis is made early.  The fallopian tube has not ruptured.  You are considered to be a good candidate for the medicine. Usually, pregnancy hormone blood levels are checked after methotrexate treatment. This is to be sure the medicine is effective. It may take 4 to 6 weeks for the pregnancy to be absorbed (though most pregnancies will be  absorbed by 3 weeks). Surgical treatment may be needed. A lighted tube (laparoscope) is used to remove the tubal pregnancy. If severe internal bleeding occurs, a cut (incision) may be made in the lower abdomen (laparotomy), and the ectopic pregnancy is removed. This stops the bleeding. Part of the fallopian tube, or the whole tube, may be removed as well (salpingectomy). After surgery, pregnancy hormone tests may be done to be sure there is no pregnancy tissue left. You may receive a RhoGAM shot if you are Rh negative and the father is Rh positive, or if you do not know the Rh type of the father. This is to prevent problems with any future pregnancy. SEEK IMMEDIATE MEDICAL CARE IF:  You have any symptoms of an ectopic pregnancy. This is a medical emergency. Document Released: 08/30/2004 Document Revised: 10/15/2011 Document Reviewed: 02/19/2013 [facility] Patient Information 2014 [place], [place].

## 2013-06-10 NOTE — Progress Notes (Signed)
Subjective:     Patient ID: Brittney Knight, female   DOB: 1986/04/14, 27 y.o.   MRN: [id number]  HPI Pt was seen at [facility] ED with bleeding and pain in pregnancy.  Declined exam because it was a female provider.  sono revealed no IUP but, no adnexal masses.  She reports some discomfort but, it is not as bad as previously.  She denies further bleeding.   Review of Systems     Objective:   Physical Exam BP 101/66  Pulse 82  Temp(Src) 97.7 F (36.5 C) (Oral)  Resp 16  Ht 5\' 1"  (1.549 m)  Wt 94 lb 9.6 oz (42.91 kg)  BMI 17.88 kg/m2  LMP 06/01/2013 Pt in NAD Abd: soft, NT; ND GU: EGBUS: no lesions Vagina: no blood in vault Cervix: no lesion; no mucopurulent d/c Uterus: small, mobile slightly enlarged and nontender Adnexa: no masses; sl tender bilaterally  06/07/2013 CLINICAL DATA: 27 year old female with pelvic cramps. Pink  discharge. Quantitative beta HCG 1,405. Estimated gestational age by  LMP 4 weeks and 6 days.  EXAM:  OBSTETRIC <14 WK [place] AND TRANSVAGINAL OB US  TECHNIQUE:  Both transabdominal and transvaginal ultrasound examinations were  performed for complete evaluation of the gestation as well as the  maternal uterus, adnexal regions, and pelvic cul-de-sac.  Transvaginal technique was performed to assess early pregnancy.  COMPARISON: None.  FINDINGS:  Intrauterine gestational sac: None visible.  Yolk sac: None  Embryo: None  Cardiac Activity: None  Maternal uterus/adnexae:  Endometrium thickened to 12 mm and appears homogeneous.  Normal right ovary measuring 2.8 x 1.3 x 3.0 cm. Several small right  ovarian follicles.  Larger left ovary measuring 4.1 x 2.4 x 2.4 cm. Several small  follicles. Small peripherally hypervascular hypoechoic area  measuring about 12 mm (image 42) which might represent a corpus  luteum.  No pelvic free fluid.  IMPRESSION:  No intrauterine gestational sac identified. No suspicious adnexal  mass or pelvic free fluid.  Recommend follow-up  quantitative B-HCG levels and follow-up US as  necessary to confirm and/or assess viability.   06/07/2013 Quant hcg 1405        Assessment:     Pain and bleeding in early pregnancy - discussed with pt the possibility of ectopic pregnancy with its risks Rh- given Rhogam in the  ED     Plan:     Quant HCG today rec repeat sono when HCG >2500 to check for IUP F/u for new OB if IUP noted

## 2013-06-10 NOTE — Addendum Note (Signed)
Addended by: Candelaria Stagers E on: 06/10/2013 04:06 PM   Modules accepted: Orders

## 2013-06-15 ENCOUNTER — Ambulatory Visit (HOSPITAL_COMMUNITY)
Admission: RE | Admit: 2013-06-15 | Discharge: 2013-06-15 | Disposition: A | Discharge status: Home / Self Care | Payer: Managed Care, Other (non HMO) | Source: Ambulatory Visit | Attending: Obstetrics & Gynecology | Admitting: Obstetrics & Gynecology

## 2013-06-15 ENCOUNTER — Encounter: Payer: Self-pay | Admitting: Obstetrics & Gynecology

## 2013-06-15 DIAGNOSIS — O209 Hemorrhage in early pregnancy, unspecified: Secondary | ICD-10-CM | POA: Insufficient documentation

## 2013-06-15 DIAGNOSIS — O3680X0 Pregnancy with inconclusive fetal viability, not applicable or unspecified: Secondary | ICD-10-CM | POA: Insufficient documentation

## 2013-06-25 LAB — OB RESULTS CONSOLE RUBELLA ANTIBODY, IGM: Rubella: IMMUNE

## 2013-06-25 LAB — OB RESULTS CONSOLE HIV ANTIBODY (ROUTINE TESTING): HIV: NONREACTIVE

## 2013-06-25 LAB — OB RESULTS CONSOLE RPR: RPR: NONREACTIVE

## 2013-06-25 LAB — OB RESULTS CONSOLE ABO/RH: RH Type: NEGATIVE

## 2013-06-25 LAB — OB RESULTS CONSOLE HEPATITIS B SURFACE ANTIGEN: HEP B S AG: NEGATIVE

## 2013-06-25 LAB — OB RESULTS CONSOLE ANTIBODY SCREEN: ANTIBODY SCREEN: NEGATIVE

## 2013-07-08 LAB — OB RESULTS CONSOLE GC/CHLAMYDIA
Chlamydia: NEGATIVE
Gonorrhea: NEGATIVE

## 2013-08-06 NOTE — L&D Delivery Note (Signed)
Operative Delivery Note  FHT down to 60s and recovered to 90-100s, but stayed there about 10 min despite lateral position change, scalp stimulation, so decision was made for vacuum extraction. 2 different vacuums needed to be tried due to very narrow maternal introitus, narrow pubic arch and station at +1. Verbal consent: obtained from patient.  Risks and benefits discussed including but are not limited to the risks of anesthesia, bleeding, infection, damage to maternal tissues, fetal cephalhematoma.  There is also the risk of inability to effect vaginal delivery of the head, or shoulder dystocia that cannot be resolved by established maneuvers, leading to the need for emergency cesarean section. Patient agreed. Epidural was working very well.   At 9:22 AM a viable and healthy female was delivered via Vaginal, Vacuum (Extractor-Mashroom).  Presentation: vertex; Position: Right,, Occiput,, Anterior; Station: +1 +2. First 2 consecutive contractions and traction brought the baby down to +2. Perineum was tight, lidocaine infiltrated and left mediolateral episiotomy given. Mother continued to push with 3rd contraction without vacuum. Vacuum reapplied with 4th contraction with excellent maternal effort head delivery was achieved. All vacuum pulls were very well controlled, no pop-offs, bringing down pressure in between contractions, each application with 3 pushes with excellent materal effort, infant was delivered, was floppy and ashen, handed to NICU after cord clamped and cut.   APGAR: 4, 8; weight -pending NICU team received the infant. .   Placenta status: Intact, Spontaneous, sent to Pathology.    Cord: 3 vessels with the following complications: Marginal cord and Velamentous vessels.  Cord pH: 7.06  Anesthesia: Epidural Local: Lidocaine Instruments: Mashroom vacuum extractor Episiotomy: Left Mediolateral Lacerations: none Suture Repair: 3.0 vicryl rapide Est. Blood Loss (mL): 250 cc  Mom to  postpartum.  Baby to Couplet care / Skin to Skin.  Tradarius Reinwald R 02/05/2014, 10:39 AM

## 2013-12-09 ENCOUNTER — Other Ambulatory Visit: Payer: Self-pay

## 2013-12-14 ENCOUNTER — Other Ambulatory Visit (HOSPITAL_COMMUNITY): Payer: Self-pay | Admitting: Obstetrics & Gynecology

## 2013-12-14 DIAGNOSIS — IMO0002 Reserved for concepts with insufficient information to code with codable children: Secondary | ICD-10-CM

## 2013-12-14 DIAGNOSIS — Z3689 Encounter for other specified antenatal screening: Secondary | ICD-10-CM

## 2013-12-15 ENCOUNTER — Ambulatory Visit (HOSPITAL_COMMUNITY): Payer: Managed Care, Other (non HMO) | Attending: Obstetrics & Gynecology

## 2013-12-18 ENCOUNTER — Other Ambulatory Visit (HOSPITAL_COMMUNITY): Payer: Self-pay | Admitting: Obstetrics & Gynecology

## 2013-12-18 DIAGNOSIS — Z3689 Encounter for other specified antenatal screening: Secondary | ICD-10-CM

## 2013-12-18 DIAGNOSIS — O365990 Maternal care for other known or suspected poor fetal growth, unspecified trimester, not applicable or unspecified: Secondary | ICD-10-CM

## 2013-12-22 ENCOUNTER — Encounter (HOSPITAL_COMMUNITY): Payer: Self-pay

## 2013-12-22 ENCOUNTER — Other Ambulatory Visit (HOSPITAL_COMMUNITY): Payer: Self-pay | Admitting: Obstetrics & Gynecology

## 2013-12-22 ENCOUNTER — Ambulatory Visit (HOSPITAL_COMMUNITY)
Admission: RE | Admit: 2013-12-22 | Discharge: 2013-12-22 | Disposition: A | Discharge status: Home / Self Care | Payer: Managed Care, Other (non HMO) | Source: Ambulatory Visit | Attending: Obstetrics & Gynecology | Admitting: Obstetrics & Gynecology

## 2013-12-22 DIAGNOSIS — Z3689 Encounter for other specified antenatal screening: Secondary | ICD-10-CM

## 2013-12-22 DIAGNOSIS — O365990 Maternal care for other known or suspected poor fetal growth, unspecified trimester, not applicable or unspecified: Secondary | ICD-10-CM

## 2014-01-06 LAB — OB RESULTS CONSOLE GBS: GBS: NEGATIVE

## 2014-01-22 ENCOUNTER — Telehealth (HOSPITAL_COMMUNITY): Payer: Self-pay | Admitting: *Deleted

## 2014-01-22 ENCOUNTER — Encounter (HOSPITAL_COMMUNITY): Payer: Self-pay | Admitting: *Deleted

## 2014-01-22 NOTE — Telephone Encounter (Signed)
Preadmission screen Interpreter number [phone]

## 2014-01-24 ENCOUNTER — Inpatient Hospital Stay (HOSPITAL_COMMUNITY)
Admission: RE | Admit: 2014-01-24 | Payer: Managed Care, Other (non HMO) | Source: Ambulatory Visit | Admitting: Obstetrics & Gynecology

## 2014-02-04 ENCOUNTER — Inpatient Hospital Stay (HOSPITAL_COMMUNITY)
Admission: AD | Admit: 2014-02-04 | Discharge: 2014-02-07 | DRG: 775 | Disposition: A | Discharge status: Home / Self Care | Payer: Managed Care, Other (non HMO) | Source: Ambulatory Visit | Attending: Obstetrics & Gynecology | Admitting: Obstetrics & Gynecology

## 2014-02-04 DIAGNOSIS — Z833 Family history of diabetes mellitus: Secondary | ICD-10-CM

## 2014-02-04 DIAGNOSIS — O365990 Maternal care for other known or suspected poor fetal growth, unspecified trimester, not applicable or unspecified: Principal | ICD-10-CM | POA: Diagnosis present

## 2014-02-04 DIAGNOSIS — IMO0002 Reserved for concepts with insufficient information to code with codable children: Secondary | ICD-10-CM | POA: Diagnosis present

## 2014-02-04 DIAGNOSIS — O360990 Maternal care for other rhesus isoimmunization, unspecified trimester, not applicable or unspecified: Secondary | ICD-10-CM | POA: Diagnosis present

## 2014-02-04 MED ORDER — LACTATED RINGERS IV SOLN
INTRAVENOUS | Status: DC
Start: 2014-02-04 — End: 2014-02-05

## 2014-02-04 MED ORDER — CITRIC ACID-SODIUM CITRATE 334-500 MG/5ML PO SOLN
30.0000 mL | ORAL | Status: DC | PRN
  Filled 2014-02-04: qty 15

## 2014-02-04 MED ORDER — LACTATED RINGERS IV SOLN: 500.0000 mL | INTRAVENOUS | Status: DC | PRN

## 2014-02-04 MED ORDER — SODIUM CHLORIDE 0.9 % IJ SOLN: 3.0000 mL | INTRAMUSCULAR | Status: DC | PRN

## 2014-02-04 MED ORDER — IBUPROFEN 600 MG PO TABS
600.0000 mg | ORAL_TABLET | Freq: Four times a day (QID) | ORAL | Status: DC | PRN
  Administered 2014-02-05: 600 mg via ORAL
  Filled 2014-02-04: qty 1

## 2014-02-04 MED ORDER — MISOPROSTOL 25 MCG QUARTER TABLET
25.0000 ug | ORAL_TABLET | ORAL | Status: DC | PRN
  Administered 2014-02-04: 25 ug via VAGINAL
  Filled 2014-02-04: qty 0.25
  Filled 2014-02-04: qty 1
  Filled 2014-02-04: qty 0.25

## 2014-02-04 MED ORDER — ACETAMINOPHEN 325 MG PO TABS: 650.0000 mg | ORAL_TABLET | ORAL | Status: DC | PRN

## 2014-02-04 MED ORDER — LIDOCAINE HCL (PF) 1 % IJ SOLN
30.0000 mL | INTRAMUSCULAR | Status: AC | PRN
  Administered 2014-02-05: 30 mL via SUBCUTANEOUS
  Filled 2014-02-04: qty 30

## 2014-02-04 MED ORDER — TERBUTALINE SULFATE 1 MG/ML IJ SOLN: 0.2500 mg | Freq: Once | INTRAMUSCULAR | Status: AC | PRN

## 2014-02-04 MED ORDER — BUTORPHANOL TARTRATE 1 MG/ML IJ SOLN: 1.0000 mg | INTRAMUSCULAR | Status: DC | PRN

## 2014-02-04 MED ORDER — SODIUM CHLORIDE 0.9 % IV SOLN: 250.0000 mL | INTRAVENOUS | Status: DC | PRN

## 2014-02-04 MED ORDER — OXYTOCIN 40 UNITS IN LACTATED RINGERS INFUSION - SIMPLE MED
62.5000 mL/h | INTRAVENOUS | Status: DC
  Filled 2014-02-04: qty 1000

## 2014-02-04 MED ORDER — ONDANSETRON HCL 4 MG/2ML IJ SOLN: 4.0000 mg | Freq: Four times a day (QID) | INTRAMUSCULAR | Status: DC | PRN

## 2014-02-04 MED ORDER — SODIUM CHLORIDE 0.9 % IJ SOLN: 3.0000 mL | Freq: Two times a day (BID) | INTRAMUSCULAR | Status: DC

## 2014-02-04 MED ORDER — OXYCODONE-ACETAMINOPHEN 5-325 MG PO TABS: 1.0000 | ORAL_TABLET | ORAL | Status: DC | PRN

## 2014-02-04 MED ORDER — ZOLPIDEM TARTRATE 5 MG PO TABS: 5.0000 mg | ORAL_TABLET | Freq: Every evening | ORAL | Status: DC | PRN

## 2014-02-04 MED ORDER — OXYTOCIN BOLUS FROM INFUSION
500.0000 mL | INTRAVENOUS | Status: DC
  Administered 2014-02-05: 500 mL via INTRAVENOUS

## 2014-02-05 ENCOUNTER — Encounter (HOSPITAL_COMMUNITY): Payer: Managed Care, Other (non HMO) | Admitting: Anesthesiology

## 2014-02-05 ENCOUNTER — Inpatient Hospital Stay (HOSPITAL_COMMUNITY): Payer: Managed Care, Other (non HMO) | Admitting: Anesthesiology

## 2014-02-05 ENCOUNTER — Encounter (HOSPITAL_COMMUNITY): Payer: Self-pay | Admitting: Obstetrics and Gynecology

## 2014-02-05 LAB — RPR

## 2014-02-05 LAB — CBC
HCT: 36.7 % (ref 36.0–46.0)
Hemoglobin: 13 g/dL (ref 12.0–15.0)
MCH: 31.2 pg (ref 26.0–34.0)
MCHC: 35.4 g/dL (ref 30.0–36.0)
MCV: 88 fL (ref 78.0–100.0)
Platelets: 188 10*3/uL (ref 150–400)
RBC: 4.17 MIL/uL (ref 3.87–5.11)
RDW: 13.1 % (ref 11.5–15.5)
WBC: 9 10*3/uL (ref 4.0–10.5)

## 2014-02-05 MED ORDER — LACTATED RINGERS IV SOLN: 500.0000 mL | Freq: Once | INTRAVENOUS | Status: DC

## 2014-02-05 MED ORDER — DIPHENHYDRAMINE HCL 50 MG/ML IJ SOLN: 12.5000 mg | INTRAMUSCULAR | Status: DC | PRN

## 2014-02-05 MED ORDER — TETANUS-DIPHTH-ACELL PERTUSSIS 5-2.5-18.5 LF-MCG/0.5 IM SUSP: 0.5000 mL | Freq: Once | INTRAMUSCULAR | Status: DC

## 2014-02-05 MED ORDER — TERBUTALINE SULFATE 1 MG/ML IJ SOLN
INTRAMUSCULAR | Status: AC
  Administered 2014-02-05: 0.25 mg
  Filled 2014-02-05: qty 1

## 2014-02-05 MED ORDER — SENNOSIDES-DOCUSATE SODIUM 8.6-50 MG PO TABS
2.0000 | ORAL_TABLET | ORAL | Status: DC
  Administered 2014-02-06: 2 via ORAL
  Filled 2014-02-05: qty 2

## 2014-02-05 MED ORDER — OXYCODONE-ACETAMINOPHEN 5-325 MG PO TABS: 1.0000 | ORAL_TABLET | ORAL | Status: DC | PRN

## 2014-02-05 MED ORDER — PHENYLEPHRINE 40 MCG/ML (10ML) SYRINGE FOR IV PUSH (FOR BLOOD PRESSURE SUPPORT)
80.0000 ug | PREFILLED_SYRINGE | INTRAVENOUS | Status: DC | PRN
  Filled 2014-02-05: qty 2
  Filled 2014-02-05: qty 10

## 2014-02-05 MED ORDER — FENTANYL 2.5 MCG/ML BUPIVACAINE 1/10 % EPIDURAL INFUSION (WH - ANES)
14.0000 mL/h | INTRAMUSCULAR | Status: DC | PRN
  Administered 2014-02-05: 14 mL/h via EPIDURAL
  Filled 2014-02-05: qty 125

## 2014-02-05 MED ORDER — EPHEDRINE 5 MG/ML INJ
10.0000 mg | INTRAVENOUS | Status: DC | PRN
  Filled 2014-02-05: qty 2

## 2014-02-05 MED ORDER — IBUPROFEN 600 MG PO TABS
600.0000 mg | ORAL_TABLET | Freq: Four times a day (QID) | ORAL | Status: DC
  Administered 2014-02-05 – 2014-02-07 (×8): 600 mg via ORAL
  Filled 2014-02-05 (×8): qty 1

## 2014-02-05 MED ORDER — ONDANSETRON HCL 4 MG/2ML IJ SOLN: 4.0000 mg | INTRAMUSCULAR | Status: DC | PRN

## 2014-02-05 MED ORDER — ONDANSETRON HCL 4 MG PO TABS: 4.0000 mg | ORAL_TABLET | ORAL | Status: DC | PRN

## 2014-02-05 MED ORDER — BENZOCAINE-MENTHOL 20-0.5 % EX AERO
1.0000 "application " | INHALATION_SPRAY | CUTANEOUS | Status: DC | PRN
  Administered 2014-02-05: 1 via TOPICAL
  Filled 2014-02-05: qty 56

## 2014-02-05 MED ORDER — ZOLPIDEM TARTRATE 5 MG PO TABS: 5.0000 mg | ORAL_TABLET | Freq: Every evening | ORAL | Status: DC | PRN

## 2014-02-05 MED ORDER — DIBUCAINE 1 % RE OINT: 1.0000 "application " | TOPICAL_OINTMENT | RECTAL | Status: DC | PRN

## 2014-02-05 MED ORDER — PHENYLEPHRINE 40 MCG/ML (10ML) SYRINGE FOR IV PUSH (FOR BLOOD PRESSURE SUPPORT)
80.0000 ug | PREFILLED_SYRINGE | INTRAVENOUS | Status: DC | PRN
  Filled 2014-02-05: qty 2

## 2014-02-05 MED ORDER — WITCH HAZEL-GLYCERIN EX PADS: 1.0000 "application " | MEDICATED_PAD | CUTANEOUS | Status: DC | PRN

## 2014-02-05 MED ORDER — LIDOCAINE HCL (PF) 1 % IJ SOLN
INTRAMUSCULAR | Status: DC | PRN
  Administered 2014-02-05 (×2): 5 mL

## 2014-02-05 MED ORDER — DIPHENHYDRAMINE HCL 25 MG PO CAPS: 25.0000 mg | ORAL_CAPSULE | Freq: Four times a day (QID) | ORAL | Status: DC | PRN

## 2014-02-05 MED ORDER — SIMETHICONE 80 MG PO CHEW: 80.0000 mg | CHEWABLE_TABLET | ORAL | Status: DC | PRN

## 2014-02-05 MED ORDER — PRENATAL MULTIVITAMIN CH
1.0000 | ORAL_TABLET | Freq: Every day | ORAL | Status: DC
  Administered 2014-02-06 – 2014-02-07 (×2): 1 via ORAL
  Filled 2014-02-05 (×2): qty 1

## 2014-02-05 MED ORDER — LANOLIN HYDROUS EX OINT: TOPICAL_OINTMENT | CUTANEOUS | Status: DC | PRN

## 2014-02-05 NOTE — Progress Notes (Signed)
Patient ID: Brittney Knight, female   DOB: 25-Nov-1985, 28 y.o.   MRN: [id number] FHT reported. S/p one cytotec last night, FHT 140s/ moderate variability/ recurrent variable decels/ overall Category II. Rx Terbutaline x 1 dose. Reassess. No further cytotec.

## 2014-02-05 NOTE — Progress Notes (Signed)
Brittney Knight is a 28 y.o. G1P0 at 5163w6d, Symmetric IUGR v/s constitutionally small. Here for IOL. S/p 1 Cytotec at admission. Contractions overnight with fetal intolerance, repetitive variable decels with moderate variability and they resolved with one Terbutaline injection. Variable decels returned with return of contractions. AROM done with clear minimal fluid. Head at -2 feels unflexed/brow. Turn in left lateral and assess progress and FHT.  No narcotics. No epidural.   BP 88/44  Pulse 84  Temp(Src) 98 F (36.7 C) (Oral)  Resp 16  Ht 5\' 1"  (1.549 m)  Wt 111 lb 9.6 oz (50.621 kg)  BMI 21.10 kg/m2  LMP 05/02/2013   FHT: baseline change from 130s to 150s/ minimal variability and variable decels with some late component. Category II. UC:   irregular, every 3-5 minutes  SVE:   Dilation: 2.5 Effacement (%): 80 Station: -2 Exam by:: Dr.Denzel Etienne AROM clear.   Assessment / Plan: Fetal intolerance to labor. IUGR. Velamentour marginal cord, remote from delivery. Left lat, fluids, O2 mask and reassess. Likely C/s if not resolved since remote from delivery.    Billal Rollo R 02/05/2014, 7:16 AM

## 2014-02-05 NOTE — Anesthesia Procedure Notes (Signed)
Epidural Patient location during procedure: OB Start time: 02/05/2014 7:54 AM  Staffing Anesthesiologist: Brayton CavesJACKSON, Sai Moura Performed by: anesthesiologist   Preanesthetic Checklist Completed: patient identified, site marked, surgical consent, pre-op evaluation, timeout performed, IV checked, risks and benefits discussed and monitors and equipment checked  Epidural Patient position: sitting Prep: site prepped and draped and DuraPrep Patient monitoring: continuous pulse ox and blood pressure Approach: midline Location: L3-L4 Injection technique: LOR air  Needle:  Needle type: Tuohy  Needle gauge: 17 G Needle length: 9 cm and 9 Needle insertion depth: 5 cm cm Catheter type: closed end flexible Catheter size: 19 Gauge Catheter at skin depth: 10 cm Test dose: negative  Assessment Events: blood not aspirated, injection not painful, no injection resistance, negative IV test and no paresthesia  Additional Notes Patient identified.  Risk benefits discussed including failed block, incomplete pain control, headache, nerve damage, paralysis, blood pressure changes, nausea, vomiting, reactions to medication both toxic or allergic, and postpartum back pain.  Patient expressed understanding and wished to proceed.  All questions were answered.  Sterile technique used throughout procedure and epidural site dressed with sterile barrier dressing. No paresthesia or other complications noted.The patient did not experience any signs of intravascular injection such as tinnitus or metallic taste in mouth nor signs of intrathecal spread such as rapid motor block. Please see nursing notes for vital signs.

## 2014-02-05 NOTE — Progress Notes (Signed)
Brittney Knight is a 28 y.o. IOL for symmetric IUGR. Progressing well. FHT category II and now category I. S/p Epidural. Progressed rapidly to almost complete per RN. Head not flexed, needs exaggerated Sims, peanut ball option. Assess descent. Epidural taking time to work but pain much improved, allows pelvic exams and foley without difficulty.  BP 122/81  Pulse 72  Temp(Src) 98 F (36.7 C) (Oral)  Resp 20  Ht 5\' 1"  (1.549 m)  Wt 111 lb 9.6 oz (50.621 kg)  BMI 21.10 kg/m2  SpO2 89%  LMP 05/02/2013   FHT:  FHR: 145 bpm, variability: moderate,  accelerations:  Abscent,  decelerations:  Present variable decels with late component but resolved UC:   regular, every 3-4 minutes SVE:   Dilation: Lip/rim Effacement (%): 80 Station: 0 Exam by:: S Nix RN Continue to monitor for rotation/ flexion and descent. NICU for delivery.   Otillia Cordone R 02/05/2014, 8:40 AM

## 2014-02-05 NOTE — Anesthesia Preprocedure Evaluation (Signed)

## 2014-02-05 NOTE — H&P (Signed)
Brittney Knight is a 28 y.o. female presenting at 39.5 wks for IOL forIUGR vs constitutionally SGA infant with normal UA S/D ratio. EFW 4'12" on 6/30  PNCare from 1st trimester. Rh negative, 1st trim bleeding and Rhogam. Marginal cord insertion possible velamentous. Growth sono at 28 wks noted EFW at 18% and <10% from 32 wks. S/p MFM consult at that point and was recommended ANtesting, Dopplers and 38 wk delivery. Patient declined and continued to have wkly BPP and Dopplers that were normal. Last growth 4'12" at 0 interval growth  History OB History   Grav Para Term Preterm Abortions TAB SAB Ect Mult Living   1              No past medical history on file. No past surgical history on file. Family History: family history includes Diabetes in her maternal grandfather, maternal grandmother, paternal grandfather, and paternal grandmother; Hypertension in her maternal grandfather, maternal grandmother, and paternal grandfather. Social History:  reports that she has never smoked. She does not have any smokeless tobacco history on file. She reports that she does not drink alcohol or use illicit drugs.   Prenatal Transfer Tool  Maternal Diabetes: No Genetic Screening: Declined but AFP1 normal  Maternal Ultrasounds/Referrals: Abnormal:  Findings:   IUGR, symmetric. Velamentous marginal cord insertion Fetal Ultrasounds or other Referrals:  Referred to Materal Fetal Medicine  Maternal Substance Abuse:  No Significant Maternal Medications:  None Significant Maternal Lab Results:  Lab values include: Group B Strep negative. Rh negative, s/p Rhogam in 1st trim and at 28 wks.  Other Comments:  None  ROS  neg  Dilation: Closed Effacement (%): Thick Station: -3 Exam by:: LCarpenter,RN Blood pressure 111/73, pulse 76, temperature 98 F (36.7 C), temperature source Oral, resp. rate 16, height 5\' 1"  (1.549 m), weight 111 lb 9.6 oz (50.621 kg), last menstrual period 05/02/2013. Exam Physical Exam  A&O x  3, no acute distress. Pleasant HEENT neg, no thyromegaly Lungs CTA bilat CV RRR, S1S2 normal Abdo soft, non tender, non acute Extr no edema/ tenderness Pelvic above FHT  140s/ + accels/ no decels/ moderate variability- category I Toco rate  Prenatal labs: ABO, Rh: B/Negative/-- (11/20 0000) S/p Rhogam Antibody: Negative (11/20 0000) Rubella: Immune (11/20 0000) RPR: Nonreactive (11/20 0000)  HBsAg: Negative (11/20 0000)  HIV: Non-reactive (11/20 0000)  GBS: Negative (06/03 0000)  Normal Glucola AFP1 normal   Assessment/Plan: IOL wit Cytotec. GBS neg. EFW 4'12" VTX. Peds at birth. Narrow/flat subpubic arch.    Brittney Knight R

## 2014-02-05 NOTE — Consult Note (Signed)
Neonatology Note:   Attendance at Delivery:    I was asked by Dr. Juliene PinaMody to attend this vacuum-assisted vaginal delivery at term due to prolonged FHR deceleration (70-90 for about 12 minutes) and known IUGR infant (felt to be constitutional). The mother is a G1P0 B neg, GBS neg with velamentous cord insertion. ROM 3 hours prior to delivery, fluid clear. Infant floppy, blue, and apneic, but with HR > 100 and some reflex with suctioning at birth. We bulb suctioned for moderate, clear oral and nasal secretions and gave stimulation. The baby began to breathe but not cry out loud. We continued stimulation and gave some BBO2. A pulse oximeter was placed that showed the O2 saturation was 95% with BBO2 at 3 minutes. The baby gradually regained tone and respirations were regular by 2 minutes of life. Equal breath sounds, good air exchange, and clear breath sounds were noted. We withdrew the BBO2 and he continued to saturate at 100% in room air, so we stopped the pulse oximetry. Tone normal at 7 minutes of life. Ap 4/8/9. I spoke with the parents and let them know that the baby would need blood glucose monitoring due to SGA status. To CN to care of Pediatrician.   Doretha Souhristie C. Jadore Mcguffin, MD

## 2014-02-05 NOTE — Anesthesia Postprocedure Evaluation (Signed)
Anesthesia Post Note  Patient: [name]  Procedure(s) Performed: * No procedures listed *  Anesthesia type: Epidural  Patient location: Mother/Baby  Post pain: Pain level controlled  Post assessment: Post-op Vital signs reviewed  Last Vitals:  Filed Vitals:   02/05/14 1753  BP: 105/65  Pulse: 71  Temp: 36.9 C  Resp: 16    Post vital signs: Reviewed  Level of consciousness:alert  Complications: No apparent anesthesia complications

## 2014-02-05 NOTE — Progress Notes (Signed)
Patient ID: Brittney Knight, female   DOB: [birth year], 28 y.o.   MRN: [id number] FHT improved after terbutaline, now category I. Continue to monitor.

## 2014-02-06 LAB — CBC
HEMATOCRIT: 30.4 % — AB (ref 36.0–46.0)
HEMOGLOBIN: 10.2 g/dL — AB (ref 12.0–15.0)
MCH: 30.4 pg (ref 26.0–34.0)
MCHC: 33.9 g/dL (ref 30.0–36.0)
MCV: 89.7 fL (ref 78.0–100.0)
Platelets: 144 10*3/uL — ABNORMAL LOW (ref 150–400)
RBC: 3.39 MIL/uL — AB (ref 3.87–5.11)
RDW: 13.4 % (ref 11.5–15.5)
WBC: 14.5 10*3/uL — ABNORMAL HIGH (ref 4.0–10.5)

## 2014-02-06 LAB — ABO/RH: ABO/RH(D): B NEG

## 2014-02-06 MED ORDER — RHO D IMMUNE GLOBULIN 1500 UNIT/2ML IJ SOSY
300.0000 ug | PREFILLED_SYRINGE | Freq: Once | INTRAMUSCULAR | Status: AC
  Administered 2014-02-06: 300 ug via INTRAVENOUS
  Filled 2014-02-06: qty 2

## 2014-02-06 NOTE — Lactation Note (Signed)
This note was copied from the chart of Brittney Knight. Lactation Consultation Note  Patient Name: Brittney Leticia PennaDalal Mayson [id number]'s Date: 02/06/2014 Reason for consult: Follow-up assessment Baby 29 hours of life. Mom states baby has been at breast at least 30 minutes. Baby appears to be a late preterm baby, and nurses like a late preterm baby. Discussed late preterm behavior with mom. Attempted to assist with latching baby in football position, but baby would not latch. FOB return-demonstrated paced feeding of formula. Baby tolerated 15mls well. Assisted mom to initiate pumping--she has had a pump in her room, but is just now beginning to pump. Mom able to get colostrum flowing from left breast. Enc parents to feed baby with cues, and at least ever 8-12 times/24 hours, offering breast first, then supplementing with EBM and formula per supplementation guidelines. Enc mom to offer lots of STS. Enc parents to limit total feeds to 30 minutes so as not to tire baby. Enc mom to call out for assistance latching baby, baby appears to have a shallow latch.  Maternal Data    Feeding Feeding Type: Breast Fed (Baby had already been nursing off and on for 30 minutes. ) Length of feed: 0 min  LATCH Score/Interventions Latch: Too sleepy or reluctant, no latch achieved, no sucking elicited. Intervention(s): Skin to skin;Teach feeding cues Intervention(s): Adjust position;Assist with latch;Breast compression  Audible Swallowing: None Intervention(s): Skin to skin  Type of Nipple: Everted at rest and after stimulation  Comfort (Breast/Nipple): Soft / non-tender     Hold (Positioning): Assistance needed to correctly position infant at breast and maintain latch. (demonstrated football hold.) Intervention(s): Breastfeeding basics reviewed;Support Pillows;Position options;Skin to skin  LATCH Score: 5  Lactation Tools Discussed/Used Tools: Pump Breast pump type: Double-Electric Breast Pump   Consult  Status Consult Status: Follow-up Date: 02/07/14 Follow-up type: In-patient    Geralynn OchsWILLIARD, Waylon Hershey 02/06/2014, 2:31 PM

## 2014-02-06 NOTE — Progress Notes (Signed)
Have not observed any pumping this shift. Baby is taking increasing amounts of formula feedings, without regurges. Encouraged patient to discuss lactation plan, but she is uncomfortable with latch. (too painful, pulls away from baby.) Reassured that with continued exposure, nipples will condition to infant suckling, otherwise, regular pumping will help establish a good milk supply.

## 2014-02-06 NOTE — Progress Notes (Signed)
Patient ID: Leticia PennaDalal Wassenaar, female   DOB: 1986/03/15, 28 y.o.   MRN: [id number] PPD # 1 SVD  S:  Reports feeling very tired and sore             Tolerating po/ No nausea or vomiting             Bleeding is light             Pain controlled with ibuprofen (OTC)             Up ad lib / ambulatory / voiding without difficulties    Newborn  Information for the patient's newborn:  Phineas RealBataweel, Boy Kadiatou [[id number]][[id number]]  female  breast feeding  / Circumcision planning   O:  A & O x 3, in no apparent distress              VS:  Filed Vitals:   02/05/14 1303 02/05/14 1411 02/05/14 1753 02/06/14 0610  BP: 105/67 99/63 105/65 88/54  Pulse: 65 77 71 76  Temp: 98.3 F (36.8 C) 98.4 F (36.9 C) 98.4 F (36.9 C) 98.2 F (36.8 C)  TempSrc: Oral Oral Oral Oral  Resp: 18 18 16 17   Height:      Weight:      SpO2:    99%    LABS:  Recent Labs  02/04/14 2230 02/06/14 0541  WBC 9.0 14.5*  HGB 13.0 10.2*  HCT 36.7 30.4*  PLT 188 144*    Blood type: --/--/B NEG (07/04 0541)  Rubella: Immune (11/20 0000)     Lungs: Clear and unlabored  Heart: regular rate and rhythm / no murmurs  Abdomen: soft, non-tender, non-distended              Fundus: firm, non-tender, U-1  Perineum: repair healing well  Lochia: minimal  Extremities: no edema, no calf pain or tenderness, no Homans    A/P: PPD # 1  27 y.o., G1P1001   Principal Problem:    Vacuum extractor delivery, delivered (7/3)   Doing well - stable status  Routine post partum orders  Anticipate discharge tomorrow    Raelyn MoraAWSON, Heidy Mccubbin, M, MSN, CNM 02/06/2014, 10:37 AM

## 2014-02-06 NOTE — Plan of Care (Signed)
Problem: Discharge Progression Outcomes Goal: Complications resolved/controlled Outcome: Completed/Met Date Met:  02/06/14 Discuss inactivity and pain issues. Encouraged regular toileting schedule, plus out of bed to chair as much as possible. Cultural issues/standards preclude full participation in plan     

## 2014-02-07 LAB — RH IG WORKUP (INCLUDES ABO/RH)
ABO/RH(D): B NEG
Antibody Screen: NEGATIVE
FETAL SCREEN: NEGATIVE
Gestational Age(Wks): 36.6
UNIT DIVISION: 0

## 2014-02-07 MED ORDER — FERROUS SULFATE 325 (65 FE) MG PO TABS: 325.0000 mg | ORAL_TABLET | Freq: Two times a day (BID) | ORAL | Status: AC

## 2014-02-07 MED ORDER — IBUPROFEN 600 MG PO TABS: 600.0000 mg | ORAL_TABLET | Freq: Four times a day (QID) | ORAL | Status: AC

## 2014-02-07 NOTE — Discharge Summary (Signed)
OBSTETRICAL DISCHARGE SUMMARY   Patient ID: Brittney Knight MRN: [id number] DOB/AGE: 27/29/1987 27 y.o.  Admit date: 02/04/2014 Admission Diagnoses: IOL for IUGR    Discharge date: 02/07/2014 Discharge Diagnoses: S/P Spontaneous vaginal delivery  Reason for Admission: induction of labor for IUGR  Prenatal history: G1P1001   EDC : 02/06/2014, by Last Menstrual Period  [facility]  Primary provider : Dr. Juliene Knight Prenatal course complicated by IUGR  Prenatal Labs: ABO, Rh: B NEG, B NEG (07/04 0541)  Antibody: NEG (07/04 0541) Rubella:  Immue RPR: NON REAC (07/02 2230)  HBsAg: Negative (11/20 0000)  HIV: Non-reactive (11/20 0000)  GBS: Negative (06/03 0000)   Labor Summary: IOL forIUGR vs constitutionally SGA infant with normal UA S/D ratio. EFW 4'12" on 6/30. PNCare from 1st trimester. Rh negative, 1st trim bleeding and Rhogam. Marginal cord insertion possible velamentous. Growth sono at 28 wks noted EFW at 18% and <10% from 32 wks. S/p MFM consult at that point and was recommended ANtesting, Dopplers and 38 wk delivery. Patient declined and continued to have wkly BPP and Dopplers that were normal. Last growth 4'12" at 0 interval growth / FHT category II and now category I. S/p Epidural. Progressed rapidly to almost complete per RN. Head not flexed, needs exaggerated Sims, peanut ball option. Assess descent. Epidural for pain relief / FHT down to 60s and recovered to 90-100s, but stayed there about 10 min despite lateral position change, scalp stimulation, so decision was made for vacuum extraction. 2 different vacuums needed to be tried due to very narrow maternal introitus, narrow pubic arch and station at +1. / VAVD of viable female infant by Dr. Juliene Knight / Cord: 3 vessels with the following complications: Marginal cord and Velamentous vessels. Cord pH: 7.06 / no immediate postpartum complications noted.  Anesthesia: epidural Procedures: mediolateral  episiotomy Complications: Fetal Distress  Newborn Data:  Gender: female on 02/05/2014 Feeding method : breast Circumcision: planning outpatient d/t SGA (per request of pediatrician) Weight: 4lbs 12 oz Apgar: 4/8  Discharge Information: Condition: stable Activity: pelvic rest Diet: routine Medications: PNV, Ibuprofen and Iron   Instructions: [facility] / instructions reviewed Discharge to: home Follow up : [facility] at 6 weeks postpartum  Signed: Kenard Knight, Brittney Knight, M CNM, MSN 02/07/2014, 8:51 AM

## 2014-02-07 NOTE — Discharge Instructions (Signed)
Postpartum Depression and Baby Blues °The postpartum period begins right after the birth of a baby. During this time, there is often a great amount of joy and excitement. It is also a time of many changes in the life of the parents. Regardless of how many times a mother gives birth, each child brings new challenges and dynamics to the family. It is not unusual to have feelings of excitement along with confusing shifts in moods, emotions, and thoughts. All mothers are at risk of developing postpartum depression or the "baby blues." These mood changes can occur right after giving birth, or they may occur many months after giving birth. The baby blues or postpartum depression can be mild or severe. Additionally, postpartum depression can go away rather quickly, or it can be a long-term condition.  °CAUSES °Raised hormone levels and the rapid drop in those levels are thought to be a main cause of postpartum depression and the baby blues. A number of hormones change during and after pregnancy. Estrogen and progesterone usually decrease right after the delivery of your baby. The levels of thyroid hormone and various cortisol steroids also rapidly drop. Other factors that play a role in these mood changes include major life events and genetics.  °RISK FACTORS °If you have any of the following risks for the baby blues or postpartum depression, know what symptoms to watch out for during the postpartum period. Risk factors that may increase the likelihood of getting the baby blues or postpartum depression include: °· Having a personal or family history of depression.   °· Having depression while being pregnant.   °· Having premenstrual mood issues or mood issues related to oral contraceptives. °· Having a lot of life stress.   °· Having marital conflict.   °· Lacking a social support network.   °· Having a baby with special needs.   °· Having health problems, such as diabetes.   °SIGNS AND SYMPTOMS °Symptoms of baby blues  include: °· Brief changes in mood, such as going from extreme happiness to sadness. °· Decreased concentration.   °· Difficulty sleeping.   °· Crying spells, tearfulness.   °· Irritability.   °· Anxiety.   °Symptoms of postpartum depression typically begin within the first month after giving birth. These symptoms include: °· Difficulty sleeping or excessive sleepiness.   °· Marked weight loss.   °· Agitation.   °· Feelings of worthlessness.   °· Lack of interest in activity or food.   °Postpartum psychosis is a very serious condition and can be dangerous. Fortunately, it is rare. Displaying any of the following symptoms is cause for immediate medical attention. Symptoms of postpartum psychosis include:  °· Hallucinations and delusions.   °· Bizarre or disorganized behavior.   °· Confusion or disorientation.   °DIAGNOSIS  °A diagnosis is made by an evaluation of your symptoms. There are no medical or lab tests that lead to a diagnosis, but there are various questionnaires that a health care provider may use to identify those with the baby blues, postpartum depression, or psychosis. Often, a screening tool called the Edinburgh Postnatal Depression Scale is used to diagnose depression in the postpartum period.  °TREATMENT °The baby blues usually goes away on its own in 1-2 weeks. Social support is often all that is needed. You will be encouraged to get adequate sleep and rest. Occasionally, you may be given medicines to help you sleep.  °Postpartum depression requires treatment because it can last several months or longer if it is not treated. Treatment may include individual or group therapy, medicine, or both to address any social, physiological, and psychological   factors that may play a role in the depression. Regular exercise, a healthy diet, rest, and social support may also be strongly recommended.  Postpartum psychosis is more serious and needs treatment right away. Hospitalization is often needed. HOME CARE  INSTRUCTIONS  Get as much rest as you can. Nap when the baby sleeps.   Exercise regularly. Some women find yoga and walking to be beneficial.   Eat a balanced and nourishing diet.   Do little things that you enjoy. Have a cup of tea, take a bubble bath, read your favorite magazine, or listen to your favorite music.  Avoid alcohol.   Ask for help with household chores, cooking, grocery shopping, or running errands as needed. Do not try to do everything.   Talk to people close to you about how you are feeling. Get support from your partner, family members, friends, or other new moms.  Try to stay positive in how you think. Think about the things you are grateful for.   Do not spend a lot of time alone.   Only take over-the-counter or prescription medicine as directed by your health care provider.  Keep all your postpartum appointments.   Let your health care provider know if you have any concerns.  SEEK MEDICAL CARE IF: You are having a reaction to or problems with your medicine. SEEK IMMEDIATE MEDICAL CARE IF:  You have suicidal feelings.   You think you may harm the baby or someone else. MAKE SURE YOU:  Understand these instructions.  Will watch your condition.  Will get help right away if you are not doing well or get worse. Document Released: 04/26/2004 Document Revised: 07/28/2013 Document Reviewed: 05/04/2013 [facility] Patient Information 2015 [place], [place]. This information is not intended to replace advice given to you by your health care provider. Make sure you discuss any questions you have with your health care provider. Nutrition for the New Mother  A new mother needs good health and nutrition so she can have energy to take care of a new baby. Whether a mother breastfeeds or formula feeds the baby, it is important to have a well-balanced diet. Foods from all the food groups should be chosen to meet the new mother's energy needs and to give her the  nutrients needed for repair and healing.  A HEALTHY EATING PLAN The My Pyramid plan for Moms outlines what you should eat to help you and your baby stay healthy. The energy and amount of food you need depends on whether or not you are breastfeeding. If you are breastfeeding you will need more nutrients. If you choose not to breastfeed, your nutrition goal should be to return to a healthy weight. Limiting calories may be needed if you are not breastfeeding.  HOME CARE INSTRUCTIONS   For a personal plan based on your unique needs, see your Registered Dietitian or visit [email or web address].mypyramid.gov.  Eat a variety of foods. The plan below will help guide you. The following chart has a suggested daily meal plan from the My Pyramid for Moms.  Eat a variety of fruits and vegetables.  Eat more dark green and orange vegetables and cooked dried beans.  Make half your grains whole grains. Choose whole instead of refined grains.  Choose low-fat or lean meats and poultry.  Choose low-fat or fat-free dairy products like milk, cheese, or yogurt. Fruits  Breastfeeding: 2 cups  Non-Breastfeeding: 2 cups  What Counts as a serving?  1 cup of fruit or juice.   cup dried fruit.  Vegetables  Breastfeeding: 3 cups  Non-Breastfeeding: 2  cups  What Counts as a serving?  1 cup raw or cooked vegetables.  Juice or 2 cups raw leafy vegetables. Grains  Breastfeeding: 8 oz  Non-Breastfeeding: 6 oz  What Counts as a serving?  1 slice bread.  1 oz ready-to-eat cereal.   cup cooked pasta, rice, or cereal. Meat and Beans  Breastfeeding: 6  oz  Non-Breastfeeding: 5  oz  What Counts as a serving?  1 oz lean meat, poultry, or fish   cup cooked dry beans   oz nuts or 1 egg  1 tbs peanut butter Milk  Breastfeeding: 3 cups  Non-Breastfeeding: 3 cups  What Counts as a serving?  1 cup milk.  8 oz yogurt.  1  oz cheese.  2 oz processed cheese. TIPS FOR THE BREASTFEEDING  MOM  Rapid weight loss is not suggested when you are breastfeeding. By simply breastfeeding, you will be able to lose the weight gained during your pregnancy. Your caregiver can keep track of your weight and tell you if your weight loss is appropriate.  Be sure to drink fluids. You may notice that you are thirstier than usual. A suggestion is to drink a glass of water or other beverage whenever you breastfeed.  Avoid alcohol as it can be passed into your breast milk.  Limit caffeine drinks to no more than 2 to 3 cups per day.  You may need to keep taking your prenatal vitamin while you are breastfeeding. Talk with your caregiver about taking a vitamin or supplement. RETURING TO A HEALTHY WEIGHT  The My Pyramid Plan for Moms will help you return to a healthy weight. It will also provide the nutrients you need.  You may need to limit "empty" calories. These include:  High fat foods like fried foods, fatty meats, fast food, butter, and mayonnaise.  High sugar foods like sodas, jelly, candy, and sweets.  Be physically active. Include 30 minutes of exercise or more each day. Choose an activity you like such as walking, swimming, biking, or aerobics. Check with your caregiver before you start to exercise. Document Released: 10/30/2007 Document Revised: 10/15/2011 Document Reviewed: 10/30/2007 [facility] Patient Information 2015 [place], [place]. This information is not intended to replace advice given to you by your health care provider. Make sure you discuss any questions you have with your health care provider. Breastfeeding and Mastitis Mastitis is inflammation of the breast tissue. It can occur in women who are breastfeeding. This can make breastfeeding painful. Mastitis will sometimes go away on its own. Your health care provider will help determine if treatment is needed. CAUSES Mastitis is often associated with a blocked milk (lactiferous) duct. This can happen when too much milk builds up  in the breast. Causes of excess milk in the breast can include:  Poor latch-on. If your baby is not latched onto the breast properly, she or he may not empty your breast completely while breastfeeding.  Allowing too much time to pass between feedings.  Wearing a bra or other clothing that is too tight. This puts extra pressure on the lactiferous ducts so milk does not flow through them as it should. Mastitis can also be caused by a bacterial infection. Bacteria may enter the breast tissue through cuts or openings in the skin. In women who are breastfeeding, this may occur because of cracked or irritated skin. Cracks in the skin are often caused when your baby does not latch on properly to the  breast. SIGNS AND SYMPTOMS  Swelling, redness, tenderness, and pain in an area of the breast.  Swelling of the glands under the arm on the same side.  Fever may or may not accompany mastitis. If an infection is allowed to progress, a collection of pus (abscess) may develop. DIAGNOSIS  Your health care provider can usually diagnose mastitis based on your symptoms and a physical exam. Tests may be done to help confirm the diagnosis. These may include:  Removal of pus from the breast by applying pressure to the area. This pus can be examined in the lab to determine which bacteria are present. If an abscess has developed, the fluid in the abscess can be removed with a needle. This can also be used to confirm the diagnosis and determine the bacteria present. In most cases, pus will not be present.  Blood tests to determine if your body is fighting a bacterial infection.  Mammogram or ultrasound tests to rule out other problems or diseases. TREATMENT  Mastitis that occurs with breastfeeding will sometimes go away on its own. Your health care provider may choose to wait 24 hours after first seeing you to decide whether a prescription medicine is needed. If your symptoms are worse after 24 hours, your health  care provider will likely prescribe an antibiotic medicine to treat the mastitis. He or she will determine which bacteria are most likely causing the infection and will then select an appropriate antibiotic medicine. This is sometimes changed based on the results of tests performed to identify the bacteria, or if there is no response to the antibiotic medicine selected. Antibiotic medicines are usually given by mouth. You may also be given medicine for pain. HOME CARE INSTRUCTIONS  Only take over-the-counter or prescription medicines for pain, fever, or discomfort as directed by your health care provider.  If your health care provider prescribed an antibiotic medicine, take the medicine as directed. Make sure you finish it even if you start to feel better.  Do not wear a tight or underwire bra. Wear a soft, supportive bra.  Increase your fluid intake, especially if you have a fever.  Continue to empty the breast. Your health care provider can tell you whether this milk is safe for your infant or needs to be thrown out. You may be told to stop nursing until your health care provider thinks it is safe for your baby. Use a breast pump if you are advised to stop nursing.  Keep your nipples clean and dry.  Empty the first breast completely before going to the other breast. If your baby is not emptying your breasts completely for some reason, use a breast pump to empty your breasts.  If you go back to work, pump your breasts while at work to stay in time with your nursing schedule.  Avoid allowing your breasts to become overly filled with milk (engorged). SEEK MEDICAL CARE IF:  You have pus-like discharge from the breast.  Your symptoms do not improve with the treatment prescribed by your health care provider within 2 days. SEEK IMMEDIATE MEDICAL CARE IF:  Your pain and swelling are getting worse.  You have pain that is not controlled with medicine.  You have a red line extending from the  breast toward your armpit.  You have a fever or persistent symptoms for more than 2-3 days.  You have a fever and your symptoms suddenly get worse. MAKE SURE YOU:   Understand these instructions.  Will watch your condition.  Will  get help right away if you are not doing well or get worse. °Document Released: 11/17/2004 Document Revised: 07/28/2013 Document Reviewed: 02/26/2013 °ExitCare® Patient Information ©2015 [employer], [employer]. This information is not intended to replace advice given to you by your health care provider. Make sure you discuss any questions you have with your health care provider. °Breastfeeding °Deciding to breastfeed is one of the best choices you can make for you and your baby. A change in hormones during pregnancy causes your breast tissue to grow and increases the number and size of your milk ducts. These hormones also allow proteins, sugars, and fats from your blood supply to make breast milk in your milk-producing glands. Hormones prevent breast milk from being released before your baby is born as well as prompt milk flow after birth. Once breastfeeding has begun, thoughts of your baby, as well as his or her sucking or crying, can stimulate the release of milk from your milk-producing glands.  °BENEFITS OF BREASTFEEDING °For Your Baby °· Your first milk (colostrum) helps your baby's digestive system function better.   °· There are antibodies in your milk that help your baby fight off infections.   °· Your baby has a lower incidence of asthma, allergies, and sudden infant death syndrome.   °· The nutrients in breast milk are better for your baby than infant formulas and are designed uniquely for your baby's needs.   °· Breast milk improves your baby's brain development.   °· Your baby is less likely to develop other conditions, such as childhood obesity, asthma, or type 2 diabetes mellitus.   °For You  °· Breastfeeding helps to create a very special bond between you and your baby.    °· Breastfeeding is convenient. Breast milk is always available at the correct temperature and costs nothing.   °· Breastfeeding helps to burn calories and helps you lose the weight gained during pregnancy.   °· Breastfeeding makes your uterus contract to its prepregnancy size faster and slows bleeding (lochia) after you give birth.   °· Breastfeeding helps to lower your risk of developing type 2 diabetes mellitus, osteoporosis, and breast or ovarian cancer later in life. °SIGNS THAT YOUR BABY IS HUNGRY °Early Signs of Hunger  °· Increased alertness or activity. °· Stretching. °· Movement of the head from side to side. °· Movement of the head and opening of the mouth when the corner of the mouth or cheek is stroked (rooting). °· Increased sucking sounds, smacking lips, cooing, sighing, or squeaking. °· Hand-to-mouth movements. °· Increased sucking of fingers or hands. °Late Signs of Hunger °· Fussing. °· Intermittent crying. °Extreme Signs of Hunger °Signs of extreme hunger will require calming and consoling before your baby will be able to breastfeed successfully. Do not wait for the following signs of extreme hunger to occur before you initiate breastfeeding:   °· Restlessness. °· A loud, strong cry. °·  Screaming. °BREASTFEEDING BASICS °Breastfeeding Initiation °· Find a comfortable place to sit or lie down, with your neck and back well supported. °· Place a pillow or rolled up blanket under your baby to bring him or her to the level of your breast (if you are seated). Nursing pillows are specially designed to help support your arms and your baby while you breastfeed. °· Make sure that your baby's abdomen is facing your abdomen.   °· Gently massage your breast. With your fingertips, massage from your chest wall toward your nipple in a circular motion. This encourages milk flow. You may need to continue this action during the feeding if your milk flows   slowly. °· Support your breast with 4 fingers underneath and  your thumb above your nipple. Make sure your fingers are well away from your nipple and your baby's mouth.   °· Stroke your baby's lips gently with your finger or nipple.   °· When your baby's mouth is open wide enough, quickly bring your baby to your breast, placing your entire nipple and as much of the colored area around your nipple (areola) as possible into your baby's mouth.   °¨ More areola should be visible above your baby's upper lip than below the lower lip.   °¨ Your baby's tongue should be between his or her lower gum and your breast.   °· Ensure that your baby's mouth is correctly positioned around your nipple (latched). Your baby's lips should create a seal on your breast and be turned out (everted). °· It is common for your baby to suck about 2-3 minutes in order to start the flow of breast milk. °Latching °Teaching your baby how to latch on to your breast properly is very important. An improper latch can cause nipple pain and decreased milk supply for you and poor weight gain in your baby. Also, if your baby is not latched onto your nipple properly, he or she may swallow some air during feeding. This can make your baby fussy. Burping your baby when you switch breasts during the feeding can help to get rid of the air. However, teaching your baby to latch on properly is still the best way to prevent fussiness from swallowing air while breastfeeding. °Signs that your baby has successfully latched on to your nipple:    °· Silent tugging or silent sucking, without causing you pain.   °· Swallowing heard between every 3-4 sucks.   °·  Muscle movement above and in front of his or her ears while sucking.   °Signs that your baby has not successfully latched on to nipple:  °· Sucking sounds or smacking sounds from your baby while breastfeeding. °· Nipple pain. °If you think your baby has not latched on correctly, slip your finger into the corner of your baby's mouth to break the suction and place it between  your baby's gums. Attempt breastfeeding initiation again. °Signs of Successful Breastfeeding °Signs from your baby:   °· A gradual decrease in the number of sucks or complete cessation of sucking.   °· Falling asleep.   °· Relaxation of his or her body.   °· Retention of a small amount of milk in his or her mouth.   °· Letting go of your breast by himself or herself. °Signs from you: °· Breasts that have increased in firmness, weight, and size 1-3 hours after feeding.   °· Breasts that are softer immediately after breastfeeding. °· Increased milk volume, as well as a change in milk consistency and color by the fifth day of breastfeeding.   °· Nipples that are not sore, cracked, or bleeding. °Signs That Your Baby is Getting Enough Milk °· Wetting at least 3 diapers in a 24-hour period. The urine should be clear and pale yellow by age 5 days. °· At least 3 stools in a 24-hour period by age 5 days. The stool should be soft and yellow. °· At least 3 stools in a 24-hour period by age 7 days. The stool should be seedy and yellow. °· No loss of weight greater than 10% of birth weight during the first 3 days of age. °· Average weight gain of 4-7 ounces (113-198 g) per week after age 4 days. °· Consistent daily weight gain by age 5   days, without weight loss after the age of 2 weeks. °After a feeding, your baby may spit up a small amount. This is common. °BREASTFEEDING FREQUENCY AND DURATION °Frequent feeding will help you make more milk and can prevent sore nipples and breast engorgement. Breastfeed when you feel the need to reduce the fullness of your breasts or when your baby shows signs of hunger. This is called "breastfeeding on demand." Avoid introducing a pacifier to your baby while you are working to establish breastfeeding (the first 4-6 weeks after your baby is born). After this time you may choose to use a pacifier. Research has shown that pacifier use during the first year of a baby's life decreases the risk of  sudden infant death syndrome (SIDS). °Allow your baby to feed on each breast as long as he or she wants. Breastfeed until your baby is finished feeding. When your baby unlatches or falls asleep while feeding from the first breast, offer the second breast. Because newborns are often sleepy in the first few weeks of life, you may need to awaken your baby to get him or her to feed. °Breastfeeding times will vary from baby to baby. However, the following rules can serve as a guide to help you ensure that your baby is properly fed: °· Newborns (babies 4 weeks of age or younger) may breastfeed every 1-3 hours. °· Newborns should not go longer than 3 hours during the day or 5 hours during the night without breastfeeding. °· You should breastfeed your baby a minimum of 8 times in a 24-hour period until you begin to introduce solid foods to your baby at around 6 months of age. °BREAST MILK PUMPING °Pumping and storing breast milk allows you to ensure that your baby is exclusively fed your breast milk, even at times when you are unable to breastfeed. This is especially important if you are going back to work while you are still breastfeeding or when you are not able to be present during feedings. Your lactation consultant can give you guidelines on how long it is safe to store breast milk.  °A breast pump is a machine that allows you to pump milk from your breast into a sterile bottle. The pumped breast milk can then be stored in a refrigerator or freezer. Some breast pumps are operated by hand, while others use electricity. Ask your lactation consultant which type will work best for you. Breast pumps can be purchased, but some hospitals and breastfeeding support groups lease breast pumps on a monthly basis. A lactation consultant can teach you how to hand express breast milk, if you prefer not to use a pump.  °CARING FOR YOUR BREASTS WHILE YOU BREASTFEED °Nipples can become dry, cracked, and sore while breastfeeding. The  following recommendations can help keep your breasts moisturized and healthy: °· Avoid using soap on your nipples.   °· Wear a supportive bra. Although not required, special nursing bras and tank tops are designed to allow access to your breasts for breastfeeding without taking off your entire bra or top. Avoid wearing underwire-style bras or extremely tight bras. °· Air dry your nipples for 3-4 minutes after each feeding.   °· Use only cotton bra pads to absorb leaked breast milk. Leaking of breast milk between feedings is normal.   °· Use lanolin on your nipples after breastfeeding. Lanolin helps to maintain your skin's normal moisture barrier. If you use pure lanolin, you do not need to wash it off before feeding your baby again. Pure lanolin is not   toxic to your baby. You may also hand express a few drops of breast milk and gently massage that milk into your nipples and allow the milk to air dry. °In the first few weeks after giving birth, some women experience extremely full breasts (engorgement). Engorgement can make your breasts feel heavy, warm, and tender to the touch. Engorgement peaks within 3-5 days after you give birth. The following recommendations can help ease engorgement: °· Completely empty your breasts while breastfeeding or pumping. You may want to start by applying warm, moist heat (in the shower or with warm water-soaked hand towels) just before feeding or pumping. This increases circulation and helps the milk flow. If your baby does not completely empty your breasts while breastfeeding, pump any extra milk after he or she is finished. °· Wear a snug bra (nursing or regular) or tank top for 1-2 days to signal your body to slightly decrease milk production. °· Apply ice packs to your breasts, unless this is too uncomfortable for you. °· Make sure that your baby is latched on and positioned properly while breastfeeding. °If engorgement persists after 48 hours of following these recommendations,  contact your health care provider or a lactation consultant. °OVERALL HEALTH CARE RECOMMENDATIONS WHILE BREASTFEEDING °· Eat healthy foods. Alternate between meals and snacks, eating 3 of each per day. Because what you eat affects your breast milk, some of the foods may make your baby more irritable than usual. Avoid eating these foods if you are sure that they are negatively affecting your baby. °· Drink milk, fruit juice, and water to satisfy your thirst (about 10 glasses a day).   °· Rest often, relax, and continue to take your prenatal vitamins to prevent fatigue, stress, and anemia. °· Continue breast self-awareness checks. °· Avoid chewing and smoking tobacco. °· Avoid alcohol and drug use. °Some medicines that may be harmful to your baby can pass through breast milk. It is important to ask your health care provider before taking any medicine, including all over-the-counter and prescription medicine as well as vitamin and herbal supplements. °It is possible to become pregnant while breastfeeding. If birth control is desired, ask your health care provider about options that will be safe for your baby. °SEEK MEDICAL CARE IF:  °· You feel like you want to stop breastfeeding or have become frustrated with breastfeeding. °· You have painful breasts or nipples. °· Your nipples are cracked or bleeding. °· Your breasts are red, tender, or warm. °· You have a swollen area on either breast. °· You have a fever or chills. °· You have nausea or vomiting. °· You have drainage other than breast milk from your nipples. °· Your breasts do not become full before feedings by the fifth day after you give birth. °· You feel sad and depressed. °· Your baby is too sleepy to eat well. °· Your baby is having trouble sleeping.   °· Your baby is wetting less than 3 diapers in a 24-hour period. °· Your baby has less than 3 stools in a 24-hour period. °· Your baby's skin or the white part of his or her eyes becomes yellow.   °· Your baby  is not gaining weight by 5 days of age. °SEEK IMMEDIATE MEDICAL CARE IF:  °· Your baby is overly tired (lethargic) and does not want to wake up and feed. °· Your baby develops an unexplained fever. °Document Released: 07/23/2005 Document Revised: 07/28/2013 Document Reviewed: 01/14/2013 °[employer]® Patient Information ©2015 [employer]. This information is not intended to   replace advice given to you by your health care provider. Make sure you discuss any questions you have with your health care provider. ° °

## 2014-02-07 NOTE — Lactation Note (Signed)
This note was copied from the chart of Brittney Roise Dalia. Lactation Consultation Note; Mom sleepy. Reports that they have given formula through the night. Mom states she pumped once with DEBP yesterday. Encouraged to pump q 3 hours to promote a good milk supply. Discussed pump for home. Mom reports they have insurance but has not called insurance company about a pump. Offered 2 week rental and parents agreeable. Paperwork left with dad to complete before DC. No questions at present.   Patient Name: Brittney Knight ZOXWR'UToday's Date: 02/07/2014 Reason for consult: Follow-up assessment   Maternal Data Formula Feeding for Exclusion: No Infant to breast within first hour of birth: No Breastfeeding delayed due to:: Infant status Does the patient have breastfeeding experience prior to this delivery?: No  Feeding Feeding Type: Breast Fed Nipple Type: Slow - flow  LATCH Score/Interventions                      Lactation Tools Discussed/Used     Consult Status Consult Status: Follow-up Date: 02/07/14 Follow-up type: In-patient    Pamelia HoitWeeks, Samia Kukla D 02/07/2014, 9:28 AM

## 2014-02-07 NOTE — Progress Notes (Signed)
Patient ID: Brittney Knight, female   DOB: 10-Dec-1985, 28 y.o.   MRN: [id number] Post Partum Day #2            Information for the patient's newborn:  Phineas RealBataweel, Boy Briget [[id number]][[id number]]  female   / circumcision planning with outpatient specialist Feeding: breast  Subjective: No HA, SOB, CP, F/C, breast symptoms. Pain controlled with ibuprofen. Normal vaginal bleeding, no clots.      Objective:  Temp:  [98.1 F (36.7 C)-98.4 F (36.9 C)] 98.4 F (36.9 C) (07/05 0551) Pulse Rate:  [82-101] 82 (07/05 0551) Resp:  [18] 18 (07/05 0551) BP: (91-100)/(58-67) 100/67 mmHg (07/05 0551)    Recent Labs  02/04/14 2230 02/06/14 0541  WBC 9.0 14.5*  HGB 13.0 10.2*  HCT 36.7 30.4*  PLT 188 144*    Blood type: B NEG (07/04 0541) / Rhophylac rec'd on 7/4 Rubella: Immune (11/20 0000)    Physical Exam:  General: cooperative, fatigued and no distress Uterine Fundus: firm Lochia: appropriate Perineum: repair healing well, edema none DVT Evaluation: No evidence of DVT seen on physical exam. Negative Homan's sign. No cords or calf tenderness. No significant calf/ankle edema.    Assessment/Plan: PPD # 2 / 28 y.o., G1P1001 S/P: vacuum extraction   IUGR (intrauterine growth restriction), delivered   Normal postpartum exam  Continue current postpartum care  D/C home   LOS: 3 days   Raelyn MoraAWSON, Calli Bashor, M, MSN, CNM 02/07/2014, 8:42 AM

## 2014-02-07 NOTE — Progress Notes (Signed)
Acknowledged order for social work consult. Parents concerned about mother's visa expiring before she could return to [place].  Met with both parents.  Father is from [place] and Mother is from [place].  Informed that they have been living in [place] for over 4 years.  Mother was reportedly on a student visa sponsored by the government and has now completed her Masters Degree.  Spouse is worried that in a few days mother would have overstayed the 60 days allotted by immigration when not in school.   Father states that he will contact the passport office on Monday to find out what he needs to get a passport for newborn.  Provided him with a contact number for [place] [employer].   Parents plan to return to [place] as soon as mother and baby are medically cleared to travel and they have necessary paperwork to travel.  Mother states that maternal grandmother is visiting from [place] to help her with the baby.  She reportedly have needed baby care items.   Parents communicated no other concerns at this time.  

## 2014-02-08 ENCOUNTER — Ambulatory Visit: Payer: Self-pay

## 2014-02-08 NOTE — Lactation Note (Signed)
This note was copied from the chart of Brittney Nishtha Kuck. Lactation Consultation Note  Baby cueing.  Encouraged mother to breastfeed infant.  Infant latched in cradle hold.  Reviewed cross cradle hold to support infants head and achieve a deeper latch. Reviewed breast massage to keep infant active a breast. Sucks and some swallows observed for 14 min. LS7. Completed DEBP 2 week pump rental. Reviewed supply, demand and engorgement care, monitor voids/stools. Encouarged mother to post pump 15-20 min 4-6 times a day. Mother has volume guidelines. Encouraged mother to call for further assistance if needed.  Patient Name: Brittney Knight Reason for consult: Follow-up assessment   Maternal Data    Feeding Feeding Type: Breast Fed Length of feed: 14 min  LATCH Score/Interventions Latch: Grasps breast easily, tongue down, lips flanged, rhythmical sucking. Intervention(s): Adjust position  Audible Swallowing: A few with stimulation Intervention(s): Alternate breast massage  Type of Nipple: Everted at rest and after stimulation  Comfort (Breast/Nipple): Filling, red/small blisters or bruises, mild/mod discomfort  Problem noted: Mild/Moderate discomfort Interventions (Mild/moderate discomfort): Hand expression  Hold (Positioning): Assistance needed to correctly position infant at breast and maintain latch. Intervention(s): Breastfeeding basics reviewed (advised to not pull back on breast)  LATCH Score: 7  Lactation Tools Discussed/Used Tools: Pump Breast pump type: Double-Electric Breast Pump   Consult Status Consult Status: Complete    Hardie PulleyBerkelhammer, [name], 10:40 AM

## 2014-02-16 NOTE — Discharge Summary (Signed)
Reviewed and agree with note and plan. V.Shonteria Abeln, MD  

## 2014-06-07 ENCOUNTER — Encounter (HOSPITAL_COMMUNITY): Payer: Self-pay | Admitting: Obstetrics and Gynecology

## 2014-10-02 IMAGING — US US OB COMP LESS 14 WK
1 series · 13 of 28 positions shown · non-contrast
Comparison: None.

CLINICAL DATA: 27-year-old female with pelvic cramps. Pink
discharge. Quantitative beta HCG [DATE]. Estimated gestational age by
LMP 4 weeks and 6 days.

EXAM:
OBSTETRIC <14 WK US AND TRANSVAGINAL OB US
TECHNIQUE: Both transabdominal and transvaginal ultrasound examinations were
performed for complete evaluation of the gestation as well as the
maternal uterus, adnexal regions, and pelvic cul-de-sac.
Transvaginal technique was performed to assess early pregnancy.

[Series 1: us ob comp less 14 wk · 0.17mm/px · 13 of 49 slices shown]
[im 2/49]
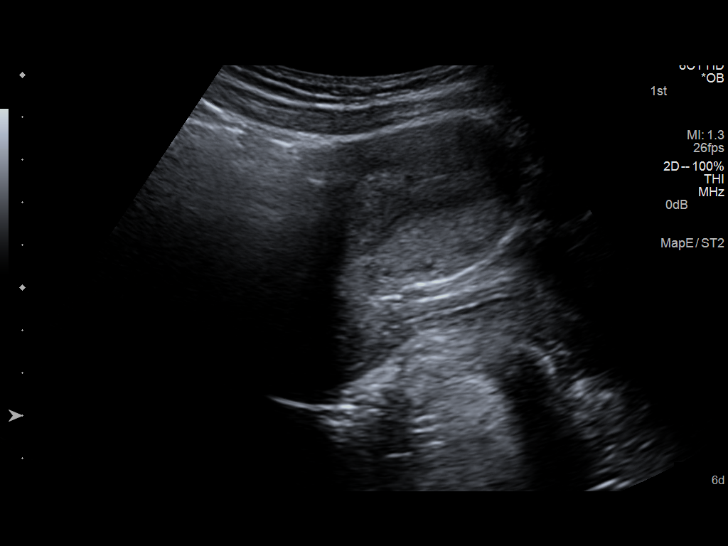
[im 6/49]
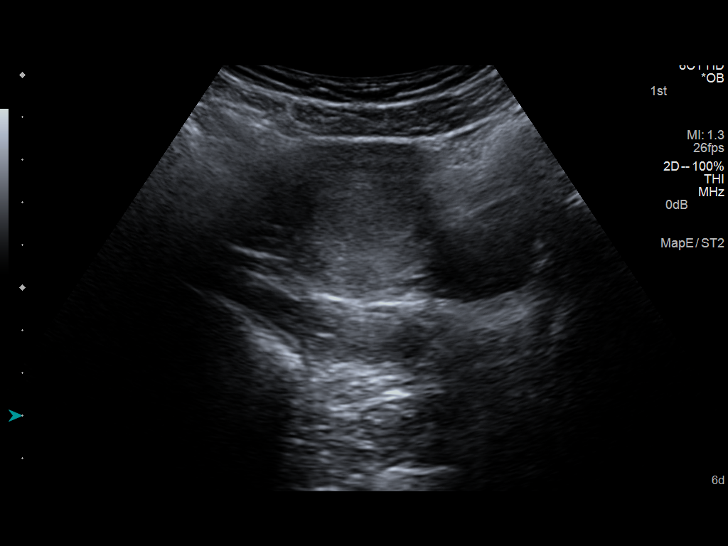
[im 9/49]
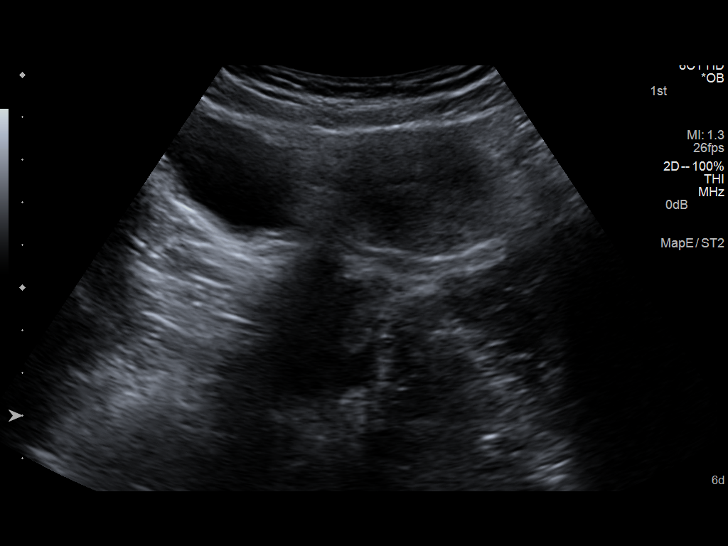
[im 13/49]
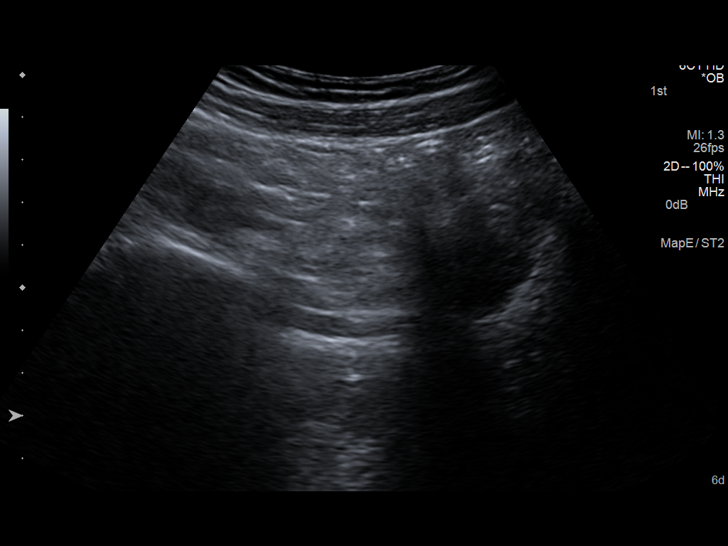
[im 17/49]
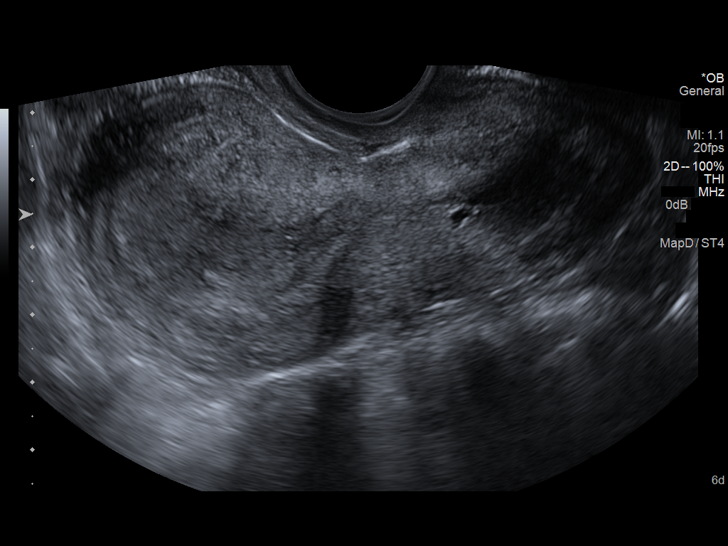
[im 20/49]
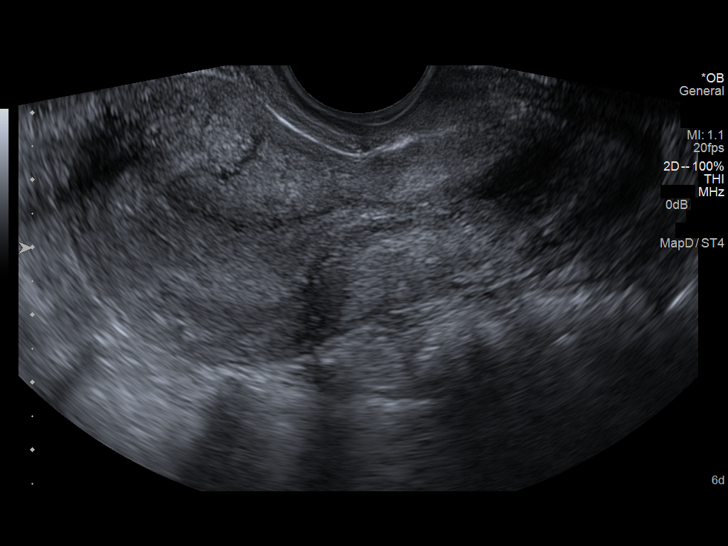
[im 25/49]
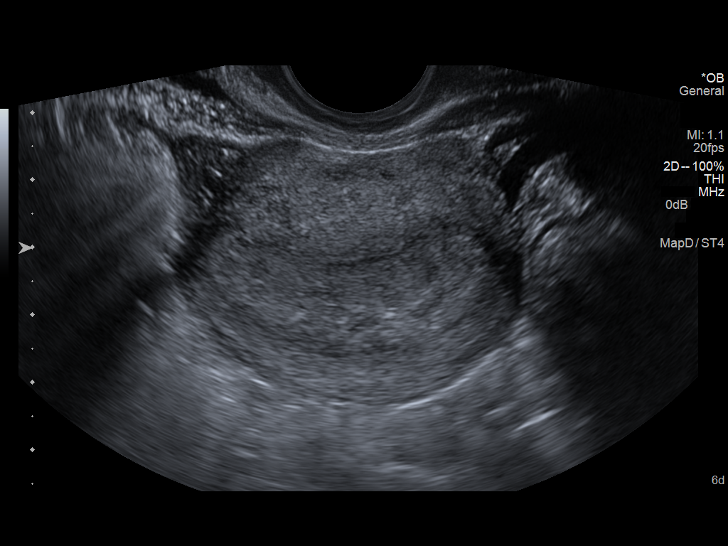
[im 29/49]
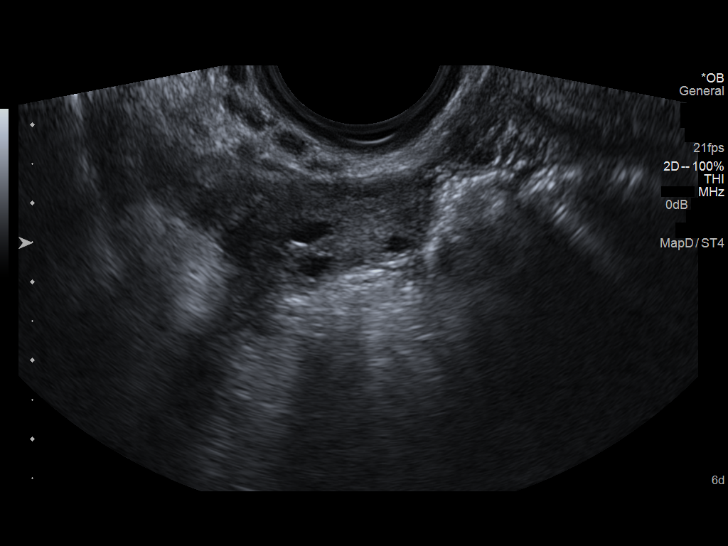
[im 33/49]
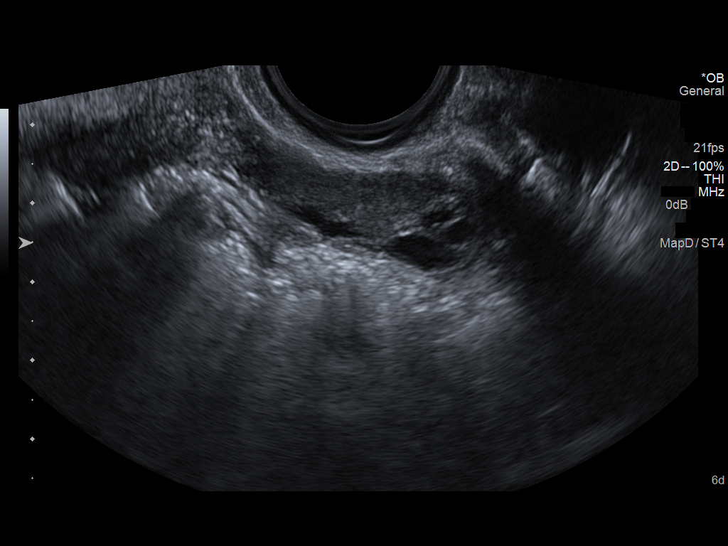
[im 36/49]
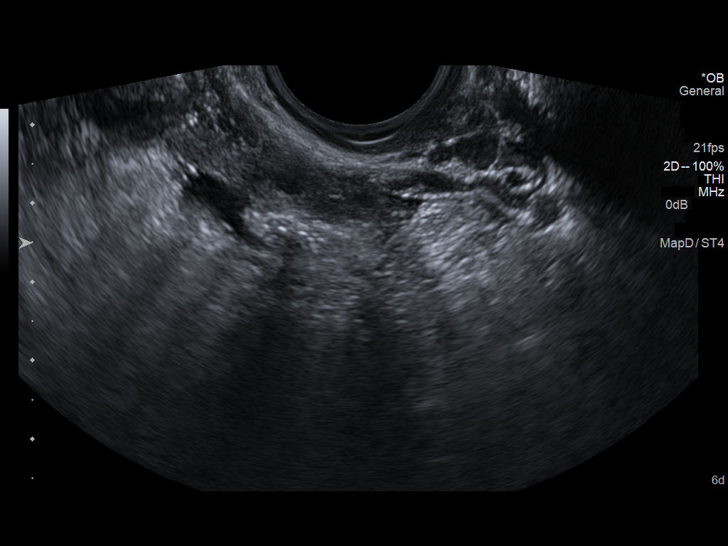
[im 40/49]
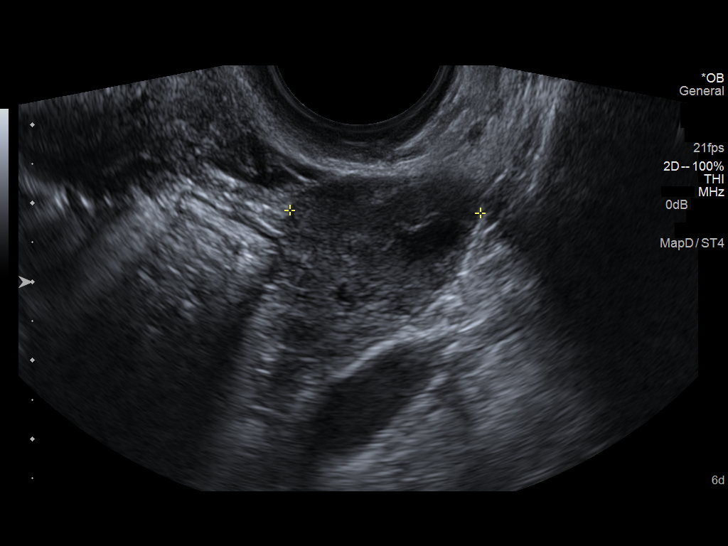
[im 43/49]
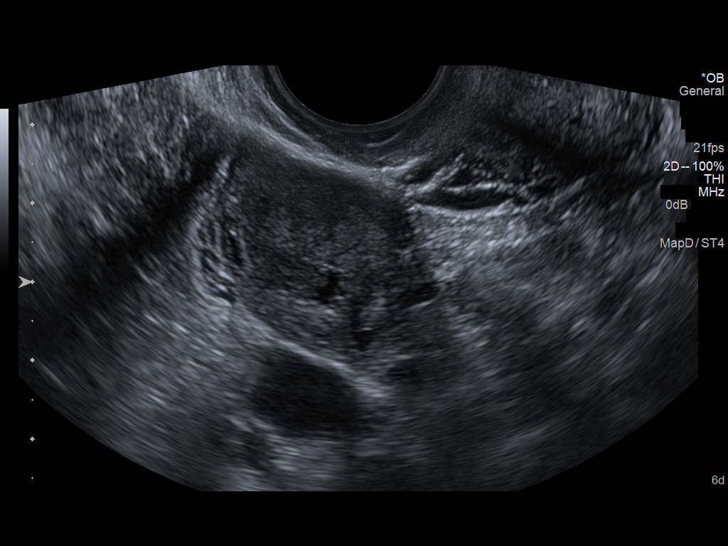
[im 47/49]
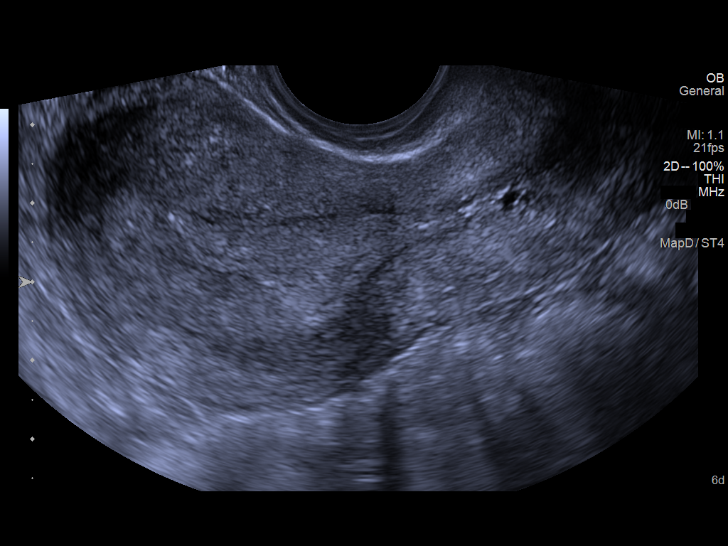

[13 of 28 positions shown; findings below may reference images not displayed]

FINDINGS: Intrauterine gestational sac: None visible.

Yolk sac:  None

Embryo:  None

Cardiac Activity: None

Maternal uterus/adnexae:

Endometrium thickened to 12 mm and appears homogeneous.

Normal right ovary measuring 2.8 x 1.3 x 3.0 cm. Several small right
ovarian follicles.

Larger left ovary measuring 4.1 x 2.4 x 2.4 cm. Several small
follicles. Small peripherally hypervascular hypoechoic area
measuring about 12 mm (image 42) which might represent a corpus
luteum.

No pelvic free fluid.
IMPRESSION: No intrauterine gestational sac identified. No suspicious adnexal
mass or pelvic free fluid.

Recommend follow-up quantitative B-HCG levels and follow-up US as
necessary to confirm and/or assess viability.

## 2014-10-10 IMAGING — US US OB TRANSVAGINAL
1 series · 14 of 28 positions shown · non-contrast
Comparison: 06/07/2013

CLINICAL DATA: Vaginal bleeding. Inconclusive viability. By LMP,
the patient is 6 weeks 0 days. EDC by LMP is 02/08/2014.

EXAM:
TRANSVAGINAL OB ULTRASOUND
TECHNIQUE: Transvaginal ultrasound was performed for complete evaluation of the
gestation as well as the maternal uterus, adnexal regions, and
pelvic cul-de-sac.

[Series 1: us ob transvaginal · 44 acquisitions, 14 frames shown]
[im 2/44]
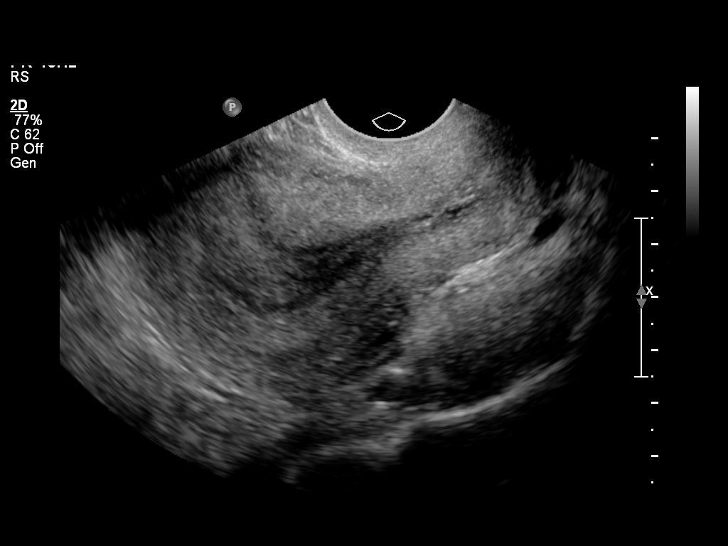
[im 5/44]
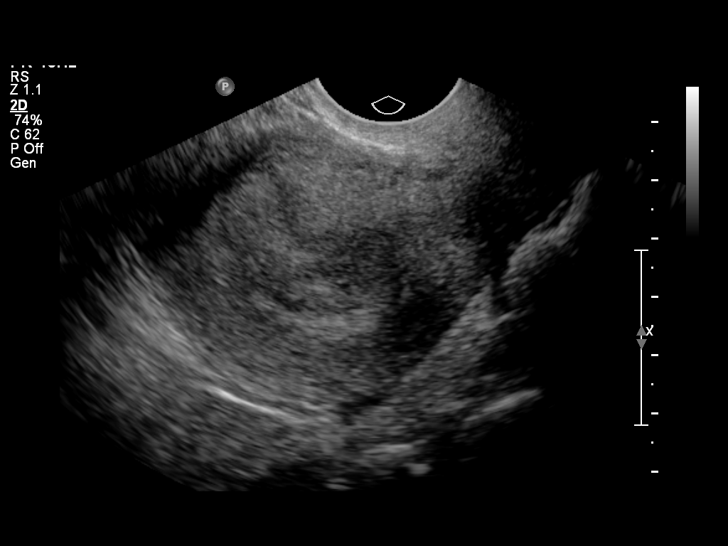
[im 8/44]
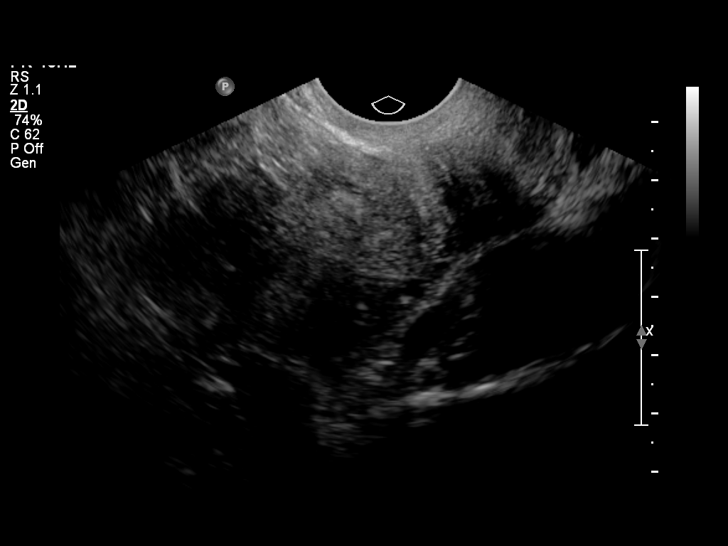
[im 12/44]
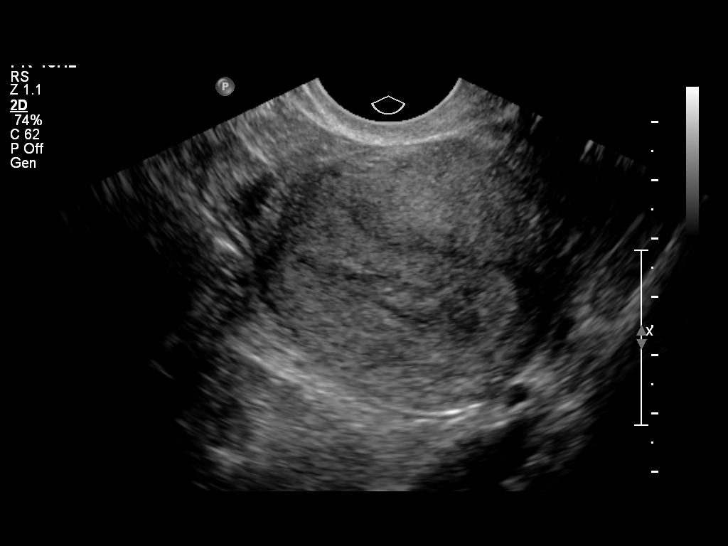
[im 15/44]
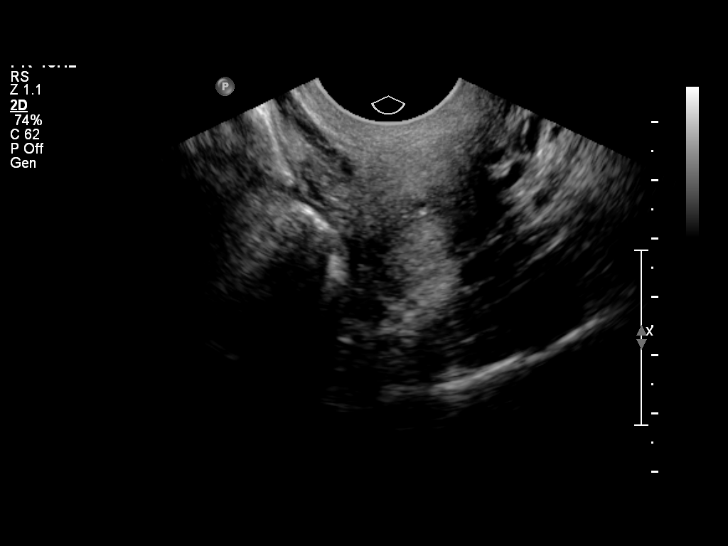
[im 18/44]
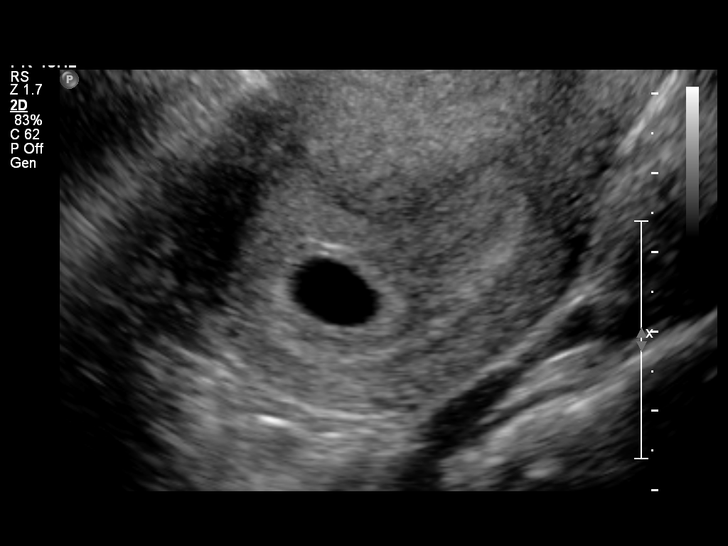
[im 21/44]
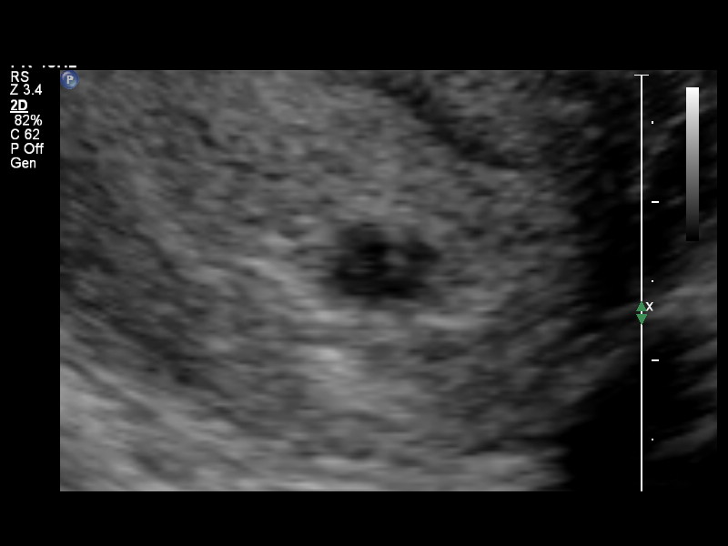
[im 24/44]
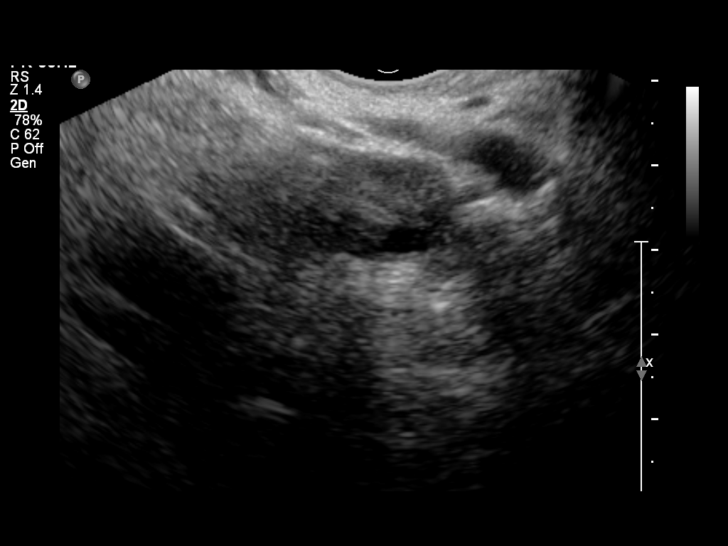
[im 28/44]
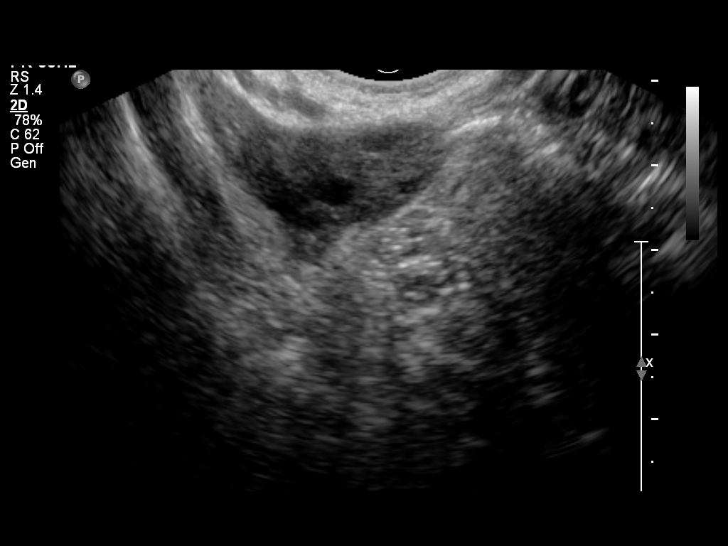
[im 31/44]
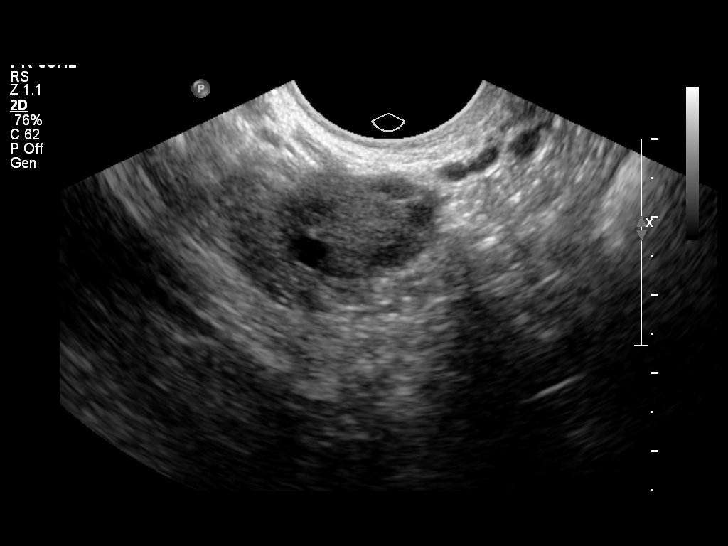
[im 34/44]
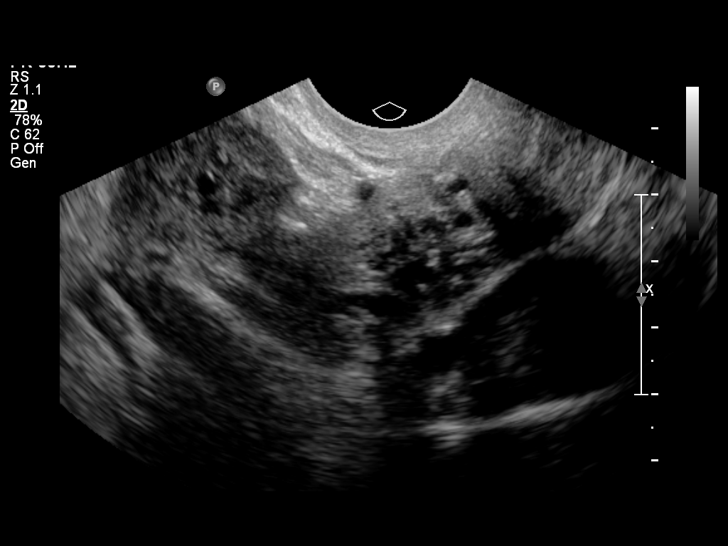
[im 37/44]
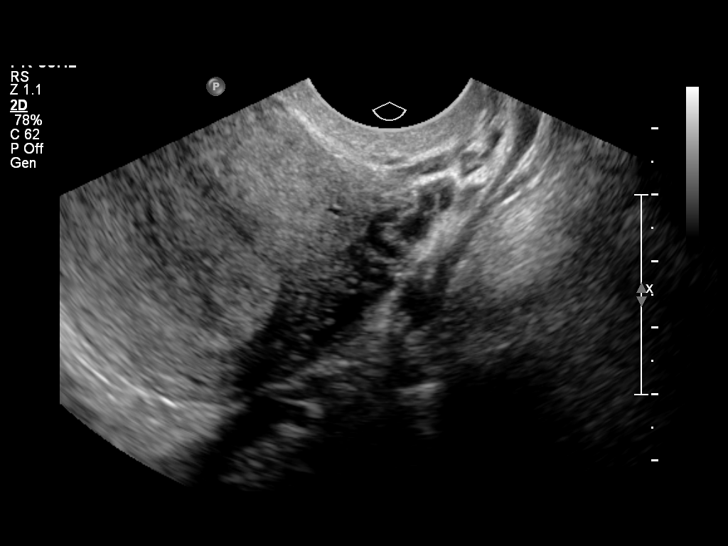
[im 40/44]
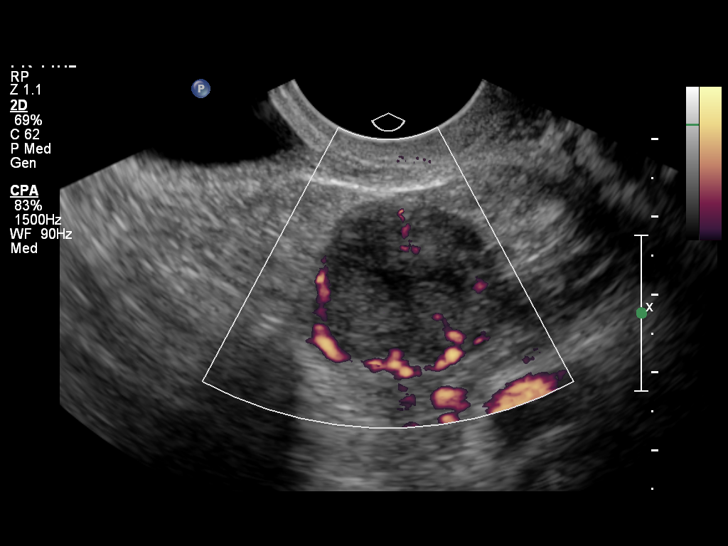
[im 44/44]
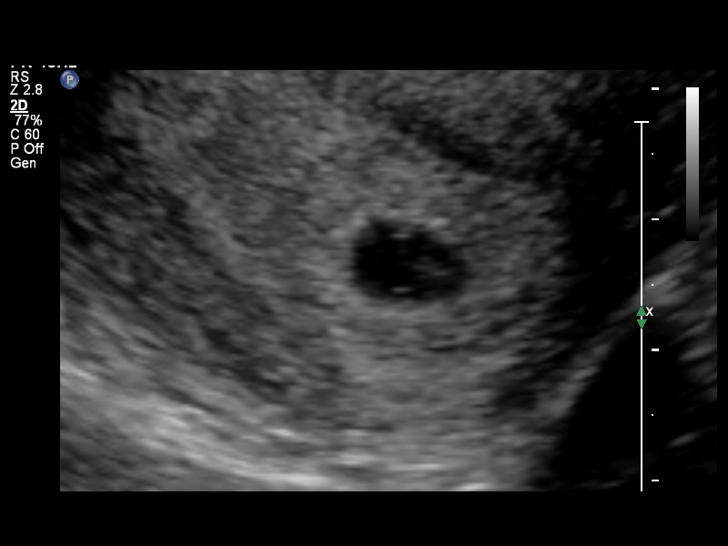

[14 of 28 positions shown; findings below may reference images not displayed]

FINDINGS: Intrauterine gestational sac: Present

Yolk sac:  Present

Embryo:  Present

Cardiac Activity: Present

Heart Rate: 111 bpm

CRL:   2.3  mm   5 w 6 d                  US EDC: 02/09/2014

Maternal uterus/adnexae: Left corpus luteum cyst is identified. No
subchorionic hemorrhage identified.
IMPRESSION: 1. Intrauterine embryo at 5 weeks 6 days by ultrasound.
2. Size and dates correlate well.
